# Patient Record
Sex: Female | Born: 1975 | Race: White | Hispanic: No | Smoking: Current some day smoker
Health system: Southern US, Community
[De-identification: ages and names within clinical notes are randomized; demographics above are authoritative.]

## PROBLEM LIST (undated history)

## (undated) DIAGNOSIS — G4730 Sleep apnea, unspecified: Secondary | ICD-10-CM

## (undated) DIAGNOSIS — E039 Hypothyroidism, unspecified: Secondary | ICD-10-CM

## (undated) DIAGNOSIS — I10 Essential (primary) hypertension: Secondary | ICD-10-CM

## (undated) DIAGNOSIS — Z9889 Other specified postprocedural states: Secondary | ICD-10-CM

## (undated) DIAGNOSIS — R112 Nausea with vomiting, unspecified: Secondary | ICD-10-CM

## (undated) DIAGNOSIS — E785 Hyperlipidemia, unspecified: Secondary | ICD-10-CM

## (undated) HISTORY — DX: Hyperlipidemia, unspecified: E78.5

## (undated) HISTORY — PX: KNEE SURGERY: SHX244

## (undated) HISTORY — PX: TUBAL LIGATION: SHX77

## (undated) HISTORY — PX: ABDOMINAL HYSTERECTOMY: SHX81

## (undated) HISTORY — PX: APPENDECTOMY: SHX54

## (undated) HISTORY — DX: Sleep apnea, unspecified: G47.30

## (undated) HISTORY — PX: TONSILLECTOMY: SUR1361

## (undated) HISTORY — DX: Essential (primary) hypertension: I10

## (undated) HISTORY — PX: DG GALL BLADDER: HXRAD326

## (undated) HISTORY — PX: CHOLECYSTECTOMY: SHX55

---

## 2004-03-17 ENCOUNTER — Emergency Department: Payer: Self-pay | Admitting: Emergency Medicine

## 2011-05-15 ENCOUNTER — Ambulatory Visit: Payer: Self-pay | Admitting: Obstetrics & Gynecology

## 2011-05-15 LAB — POTASSIUM: Potassium: 3.3 mmol/L — ABNORMAL LOW (ref 3.5–5.1)

## 2011-05-15 LAB — CBC
HCT: 44.1 % (ref 35.0–47.0)
MCHC: 32.6 g/dL (ref 32.0–36.0)
MCV: 89 fL (ref 80–100)
Platelet: 293 10*3/uL (ref 150–440)
RBC: 4.93 10*6/uL (ref 3.80–5.20)
WBC: 9.8 10*3/uL (ref 3.6–11.0)

## 2011-05-22 ENCOUNTER — Ambulatory Visit: Payer: Self-pay | Admitting: Obstetrics & Gynecology

## 2011-05-22 LAB — PREGNANCY, URINE: Pregnancy Test, Urine: NEGATIVE m[IU]/mL

## 2011-05-23 LAB — HEMOGLOBIN: HGB: 12.3 g/dL (ref 12.0–16.0)

## 2012-01-16 HISTORY — PX: SUPRACERVICAL ABDOMINAL HYSTERECTOMY: SHX5393

## 2013-03-18 ENCOUNTER — Ambulatory Visit: Payer: Self-pay | Admitting: Family Medicine

## 2014-02-18 ENCOUNTER — Ambulatory Visit: Payer: Self-pay | Admitting: Unknown Physician Specialty

## 2014-04-30 ENCOUNTER — Other Ambulatory Visit: Payer: Self-pay | Admitting: Unknown Physician Specialty

## 2014-04-30 DIAGNOSIS — D440 Neoplasm of uncertain behavior of thyroid gland: Secondary | ICD-10-CM

## 2014-05-09 NOTE — Op Note (Signed)
PATIENT NAME:  Ashley Clarke, TORTORA MR#:  [id number] DATE OF BIRTH:  1975-12-19  DATE OF PROCEDURE:  05/22/2011  PREOPERATIVE DIAGNOSES:  1. Menorrhagia. 2. Pelvic pain.   POSTOPERATIVE DIAGNOSES: 1. Menorrhagia. 2. Pelvic pain. 3. Adhesions.   PROCEDURES:  1. Laparoscopic supracervical hysterectomy.  2. Lysis of adhesions.  3. Cystoscopy.   SURGEON: Glean Salen, MD   ASSISTANT: Donzetta Matters, MD   ANESTHESIA: General.   ESTIMATED BLOOD LOSS: 150 mL.   COMPLICATIONS: None.   FINDINGS: There was a cystic left ovary. There were adhesions of the omentum to the anterior abdominal wall, adhesions to the adnexa bilaterally to the pelvic sidewall walls and posterior cul-de-sac, and adhesions of the lower uterine segment up to the anterior abdominal wall near the bladder. Cystoscopy reveals bilateral spill of blue dye through each ureteral orifice and no bladder injury   SPECIMEN: Uterus without cervix.   DISPOSITION: To recovery room in stable condition.   TECHNIQUE: The patient is prepped and draped in the usual sterile fashion after adequate anesthesia is obtained in the dorsal lithotomy position. A Foley catheter is inserted. A sponge stick is placed per vagina for manipulation purposes. Attention is then turned to the abdomen where a Veress needle is inserted through a 5 mm infraumbilical incision after Marcaine is used to anesthetize the skin in an area of a prior scar. Veress needle placement is confirmed using the hanging drop technique and the abdomen is then insufflated with CO2 gas. A 5 mm trocar is then inserted under direct visualization with the laparoscope with no injuries or bleeding noted. The patient is placed in Trendelenburg positioning. Adhesions are visualized. There are no adhesions in the area of trocar placement. An 11 mm trocar is placed in the right lower quadrant lateral to the inferior epigastric blood vessels with no injuries or bleeding noted. The camera is  placed in this port to visualize the umbilical port with no perforation or injuries noted. Adhesions that are in view of the pelvis are carefully dissected using the 5 mm Harmonic scalpel. Adhesions are well away from any bowel wall or near any structures such as ureter. A 5 mm trocar is placed in the left lower quadrant lateral to the inferior epigastric blood vessels with no injuries or bleeding noted. The patient is placed in full Trendelenburg positioning to adequately perform the surgery.   The uterus is grasped with a tenaculum. The uteroovarian blood vessels and ligaments are carefully coagulated and cut using the Harmonic scalpel along with bipolar cautery device as needed. The ovary is dissected away from the uterus and the main blood supply to the ovaries are preserved. The dissection is carried down alongside the uterus to the level of the uterine arteries which are then carefully coagulated and cut. Adhesions are dissected off the lower uterine segment to preserve bladder. Uterus is then amputated using the 5 mm Harmonic scalpel and the endocervical canal is cauterized.   A morcellator device is placed where the right trocar was located and the uterus is removed by morcellation process without complication. The pelvic cavity is then irrigated with aspiration of all fluid. Hemostasis is assured using electrocautery as well as Arista to help improve hemostasis. Interceed is placed over the cervical stump.   Cystoscopy is performed with saline distention of the bladder and a 30 degree cystoscope is inserted. Indigo carmine has been injected IV and during observation during cystoscopy blue dye is seen to extrude from each ureteral orifice. The cystoscope is  removed and Foley catheter is reinserted.   Laparoscopy is re-visualized with no bleeding noted. The right lower quadrant rectus fascia is closed with a fascial closure device using 0 Vicryl suture. Gas is then expelled and trocars are removed and  the patient is leveled. Additional suture is placed in the right lower quadrant incision to obtain hemostasis and then all skin incisions are closed with Dermabond and covered with sterile bandages. Sponge stick is removed and Foley catheter is left in place. The patient goes to the recovery room in stable condition. All sponge, instrument, and needle counts are correct.   ____________________________ R. Barnett Applebaum, MD rph:drc D: 05/22/2011 09:19:12 ET T: 05/22/2011 11:29:05 ET JOB#: [id number]  cc: Glean Salen, MD, <Dictator> Gae Dry MD ELECTRONICALLY SIGNED 05/22/2011 23:27

## 2014-05-10 LAB — HM PAP SMEAR

## 2014-05-11 LAB — BASIC METABOLIC PANEL: Glucose: 124 mg/dL

## 2014-05-11 LAB — LIPID PANEL
HDL: 35 mg/dL (ref 35–70)
LDL Cholesterol: 117 mg/dL

## 2014-05-11 LAB — TSH: TSH: 7.67 u[IU]/mL — AB (ref <=5.90)

## 2014-05-11 LAB — HEMOGLOBIN A1C: HEMOGLOBIN A1C: 5.3 % (ref 4.0–6.0)

## 2014-07-07 LAB — TSH: TSH: 4.18

## 2014-08-19 ENCOUNTER — Ambulatory Visit
Admission: RE | Admit: 2014-08-19 | Discharge: 2014-08-19 | Disposition: A | Discharge status: Home / Self Care | Payer: Managed Care, Other (non HMO) | Source: Ambulatory Visit | Attending: Unknown Physician Specialty | Admitting: Unknown Physician Specialty

## 2014-08-19 DIAGNOSIS — D440 Neoplasm of uncertain behavior of thyroid gland: Secondary | ICD-10-CM

## 2014-08-19 DIAGNOSIS — E042 Nontoxic multinodular goiter: Secondary | ICD-10-CM | POA: Insufficient documentation

## 2014-08-19 DIAGNOSIS — Z09 Encounter for follow-up examination after completed treatment for conditions other than malignant neoplasm: Secondary | ICD-10-CM | POA: Diagnosis present

## 2014-08-20 ENCOUNTER — Ambulatory Visit (INDEPENDENT_AMBULATORY_CARE_PROVIDER_SITE_OTHER): Payer: Commercial Indemnity | Admitting: Family Medicine

## 2014-08-20 ENCOUNTER — Encounter: Payer: Self-pay | Admitting: Family Medicine

## 2014-08-20 VITALS — BP 124/81 | HR 86 | Temp 98.9°F | Resp 16 | Ht 68.0 in | Wt 274.4 lb

## 2014-08-20 DIAGNOSIS — J45909 Unspecified asthma, uncomplicated: Secondary | ICD-10-CM | POA: Insufficient documentation

## 2014-08-20 DIAGNOSIS — E042 Nontoxic multinodular goiter: Secondary | ICD-10-CM | POA: Insufficient documentation

## 2014-08-20 DIAGNOSIS — I10 Essential (primary) hypertension: Secondary | ICD-10-CM | POA: Diagnosis not present

## 2014-08-20 DIAGNOSIS — E039 Hypothyroidism, unspecified: Secondary | ICD-10-CM | POA: Diagnosis not present

## 2014-08-20 MED ORDER — LEVOTHYROXINE SODIUM 112 MCG PO TABS: 112.0000 ug | ORAL_TABLET | Freq: Every day | ORAL | Status: DC

## 2014-08-20 NOTE — Progress Notes (Signed)
Name: Ashley Clarke   MRN: [id number]    DOB: 02-19-1975   Date:08/20/2014       Progress Note  Subjective  Chief Complaint  Chief Complaint  Patient presents with  . Hypertension    HPI  Here for f/u of HBP.  Has multinodular goiter.  Sees Dr. Tami Ribas re thyroid.  Feeling well.  No c/o   TSH from 07/07/14-4.18. Past Medical History  Diagnosis Date  . Hypertension   . Hyperlipidemia   . Stroke     History  Substance Use Topics  . Smoking status: Never Smoker   . Smokeless tobacco: Never Used  . Alcohol Use: No     Current outpatient prescriptions:  .  albuterol (PROAIR HFA) 108 (90 BASE) MCG/ACT inhaler, Inhale into the lungs., Disp: , Rfl:  .  losartan (COZAAR) 50 MG tablet, Take by mouth., Disp: , Rfl:  .  cyclobenzaprine (FLEXERIL) 10 MG tablet, Take by mouth., Disp: , Rfl:  .  levothyroxine (SYNTHROID, LEVOTHROID) 112 MCG tablet, Take 1 tablet (112 mcg total) by mouth daily., Disp: 90 tablet, Rfl: 3  No Known Allergies  Review of Systems  Constitutional: Positive for weight loss. Negative for fever, chills and malaise/fatigue.  HENT: Negative for hearing loss.   Eyes: Negative for blurred vision and double vision.  Respiratory: Negative for cough, sputum production, shortness of breath and wheezing.   Cardiovascular: Negative for chest pain, palpitations, orthopnea and leg swelling.  Gastrointestinal: Negative for heartburn, nausea, vomiting, abdominal pain, diarrhea and blood in stool.  Genitourinary: Negative for dysuria, urgency and frequency.  Musculoskeletal: Negative for myalgias and joint pain.  Skin: Negative for rash.  Neurological: Negative for dizziness, sensory change, focal weakness, weakness and headaches.  Psychiatric/Behavioral: Negative for depression. The patient is not nervous/anxious.       Objective  Filed Vitals:   08/20/14 1432  BP: 124/81  Pulse: 86  Temp: 98.9 F (37.2 C)  Resp: 16  Height: 5\' 8"  (1.727 m)  Weight: 274 lb  6.4 oz (124.467 kg)     Physical Exam  Constitutional: She is well-developed, well-nourished, and in no distress. No distress.  HENT:  Head: Normocephalic and atraumatic.  Eyes: Conjunctivae and EOM are normal. Pupils are equal, round, and reactive to light. No scleral icterus.  Neck: Normal range of motion. Neck supple. Thyroid mass (multinodulat goiter, R>L.) and thyromegaly present.  Cardiovascular: Normal rate, regular rhythm, normal heart sounds and intact distal pulses.  Exam reveals no gallop and no friction rub.   No murmur heard. Pulmonary/Chest: Effort normal and breath sounds normal. No respiratory distress. She has no wheezes. She has no rales.  Abdominal: Soft. Bowel sounds are normal. She exhibits no distension and no mass. There is no tenderness.  Musculoskeletal: She exhibits no edema.  Lymphadenopathy:    She has cervical adenopathy.  Vitals reviewed.        Assessment & Plan  Problem List Items Addressed This Visit      Endocrine   Adult hypothyroidism   Relevant Medications   levothyroxine (SYNTHROID, LEVOTHROID) 112 MCG tablet   Other Relevant Orders   TSH    Other Visit Diagnoses    Essential hypertension    -  Primary      1. Essential hypertension    2. Hypothyroidism, unspecified hypothyroidism type   - levothyroxine (SYNTHROID, LEVOTHROID) 112 MCG tablet; Take 1 tablet (112 mcg total) by mouth daily.  Dispense: 90 tablet; Refill: 3

## 2014-08-20 NOTE — Patient Instructions (Addendum)
Continue f/u with Dr. Tami Ribas.  Continue current meds except for thyroid change  Plan repeat TSH in 2 months.

## 2014-09-16 ENCOUNTER — Other Ambulatory Visit: Payer: Self-pay | Admitting: Family Medicine

## 2014-09-16 DIAGNOSIS — E039 Hypothyroidism, unspecified: Secondary | ICD-10-CM

## 2014-09-16 MED ORDER — LEVOTHYROXINE SODIUM 112 MCG PO TABS: 112.0000 ug | ORAL_TABLET | Freq: Every day | ORAL | Status: DC

## 2014-09-16 MED ORDER — LOSARTAN POTASSIUM 50 MG PO TABS: 50.0000 mg | ORAL_TABLET | Freq: Every day | ORAL | Status: DC

## 2014-10-05 ENCOUNTER — Encounter: Payer: Self-pay | Admitting: Family Medicine

## 2014-10-11 ENCOUNTER — Encounter: Payer: Self-pay | Admitting: Family Medicine

## 2014-10-11 ENCOUNTER — Ambulatory Visit (INDEPENDENT_AMBULATORY_CARE_PROVIDER_SITE_OTHER): Payer: Commercial Indemnity | Admitting: Family Medicine

## 2014-10-11 VITALS — BP 137/83 | HR 76 | Temp 98.4°F | Resp 16 | Ht 68.0 in | Wt 278.0 lb

## 2014-10-11 DIAGNOSIS — E669 Obesity, unspecified: Secondary | ICD-10-CM | POA: Diagnosis not present

## 2014-10-11 DIAGNOSIS — F17200 Nicotine dependence, unspecified, uncomplicated: Secondary | ICD-10-CM

## 2014-10-11 DIAGNOSIS — Z720 Tobacco use: Secondary | ICD-10-CM

## 2014-10-11 NOTE — Patient Instructions (Signed)
Referral made to Maury at [facility] for [place] weight loss and phone # given for Smoking Cessation info from [facility]. [phone]

## 2014-10-11 NOTE — Progress Notes (Signed)
Name: Ashley Clarke   MRN: [id number]    DOB: [birth year]   Date:10/11/2014       Progress Note  Subjective  Chief Complaint  Chief Complaint  Patient presents with  . Annual Exam    insurance paper work    HPI Patient here for paperwork asking for exemption from [employer]) requirements re: weight loss and smoking cessation.  She is still smoking.  Reports 1/2 ppd.  Weight has actually gone up 4# since last seem 4-6 weeks ago.  No problem-specific assessment & plan notes found for this encounter.   Past Medical History  Diagnosis Date  . Hypertension   . Hyperlipidemia   . Stroke     Social History  Substance Use Topics  . Smoking status: Current Every Day Smoker -- 1.00 packs/day    Types: Cigarettes  . Smokeless tobacco: Never Used  . Alcohol Use: No     Current outpatient prescriptions:  .  albuterol (PROAIR HFA) 108 (90 BASE) MCG/ACT inhaler, Inhale into the lungs., Disp: , Rfl:  .  cyclobenzaprine (FLEXERIL) 10 MG tablet, Take by mouth., Disp: , Rfl:  .  fluticasone (FLONASE) 50 MCG/ACT nasal spray, , Disp: , Rfl:  .  levothyroxine (SYNTHROID, LEVOTHROID) 112 MCG tablet, Take 1 tablet (112 mcg total) by mouth daily., Disp: 90 tablet, Rfl: 3 .  losartan (COZAAR) 50 MG tablet, Take 1 tablet (50 mg total) by mouth daily., Disp: 90 tablet, Rfl: 3  No Known Allergies  Review of Systems  Constitutional: Negative for fever, chills, weight loss and malaise/fatigue.  HENT: Negative for hearing loss.   Eyes: Negative for blurred vision and double vision.  Respiratory: Negative for cough, sputum production, shortness of breath and wheezing.   Cardiovascular: Negative for chest pain, palpitations, orthopnea and leg swelling.  Gastrointestinal: Positive for heartburn (occ.). Negative for nausea, vomiting, abdominal pain, diarrhea and blood in stool.  Genitourinary: Negative for dysuria, urgency and frequency.  Musculoskeletal: Negative for myalgias.   Neurological: Negative for dizziness, tremors, sensory change, focal weakness, seizures, weakness and headaches.      Objective  Filed Vitals:   10/11/14 1318  BP: 137/83  Pulse: 76  Temp: 98.4 F (36.9 C)  TempSrc: Oral  Resp: 16  Height: 5\' 8"  (1.727 m)  Weight: 278 lb (126.1 kg)     Physical Exam  Constitutional: She is well-developed, well-nourished, and in no distress. No distress.  HENT:  Head: Normocephalic and atraumatic.  Neck: Normal range of motion. Neck supple. Carotid bruit is not present. No thyromegaly present.  Cardiovascular: Normal rate, regular rhythm, normal heart sounds and intact distal pulses.  Exam reveals no gallop and no friction rub.   No murmur heard. Pulmonary/Chest: Effort normal and breath sounds normal. No respiratory distress. She has no wheezes. She has no rales.  Abdominal: Soft. Bowel sounds are normal. She exhibits no distension, no abdominal bruit and no mass. There is no tenderness.  obese  Musculoskeletal: She exhibits no edema.  Lymphadenopathy:    She has no cervical adenopathy.  Vitals reviewed.     No results found for this or any previous visit (from the past 2160 hour(s)).   Assessment & Plan  1. Obesity  - Amb ref to Medical Nutrition Therapy-MNT  2. Smoker  Smoking Cessation # for [place]- [phone]

## 2014-12-28 ENCOUNTER — Encounter: Payer: Commercial Indemnity | Admitting: Family Medicine

## 2014-12-28 NOTE — Progress Notes (Signed)
This encounter was created in error - please disregard.

## 2014-12-28 NOTE — Addendum Note (Signed)
Addended by: Larene Beach on: 12/28/2014 02:00 PM   Modules accepted: Miquel Dunn

## 2014-12-28 NOTE — Addendum Note (Signed)
Addended by: Larene Beach on: 12/28/2014 02:03 PM   Modules accepted: Level of Service, SmartSet

## 2015-08-01 ENCOUNTER — Ambulatory Visit (INDEPENDENT_AMBULATORY_CARE_PROVIDER_SITE_OTHER): Payer: Managed Care, Other (non HMO) | Admitting: Family Medicine

## 2015-08-01 ENCOUNTER — Encounter: Payer: Self-pay | Admitting: Family Medicine

## 2015-08-01 VITALS — BP 160/100 | HR 60 | Temp 98.2°F | Resp 16 | Ht 68.0 in | Wt 271.0 lb

## 2015-08-01 DIAGNOSIS — K219 Gastro-esophageal reflux disease without esophagitis: Secondary | ICD-10-CM | POA: Diagnosis not present

## 2015-08-01 DIAGNOSIS — E034 Atrophy of thyroid (acquired): Secondary | ICD-10-CM | POA: Diagnosis not present

## 2015-08-01 DIAGNOSIS — I10 Essential (primary) hypertension: Secondary | ICD-10-CM | POA: Diagnosis not present

## 2015-08-01 DIAGNOSIS — E038 Other specified hypothyroidism: Secondary | ICD-10-CM | POA: Diagnosis not present

## 2015-08-01 DIAGNOSIS — E669 Obesity, unspecified: Secondary | ICD-10-CM

## 2015-08-01 DIAGNOSIS — Z0000 Encounter for general adult medical examination without abnormal findings: Secondary | ICD-10-CM | POA: Diagnosis not present

## 2015-08-01 MED ORDER — OMEPRAZOLE 40 MG PO CPDR: 40.0000 mg | DELAYED_RELEASE_CAPSULE | Freq: Every day | ORAL | Status: DC

## 2015-08-01 MED ORDER — LOSARTAN POTASSIUM 100 MG PO TABS: 50.0000 mg | ORAL_TABLET | Freq: Every day | ORAL | Status: DC

## 2015-08-01 MED ORDER — LOSARTAN POTASSIUM 100 MG PO TABS: 100.0000 mg | ORAL_TABLET | Freq: Every day | ORAL | Status: DC

## 2015-08-01 NOTE — Progress Notes (Signed)
Name: Ashley Clarke   MRN: [id number]    DOB: 07-30-1975   Date:08/01/2015       Progress Note  Subjective  Chief Complaint  Chief Complaint  Patient presents with  . Annual Exam    HPI Here for annual female exam.  She has had a hysterectomy 2014.  Ovaries remain.  ? Supracervical hysterectomy.   She has mild asthma.  Rare use of Albuterol. She takes Levothyroxine and Losartan.  Some BPs at home run >150 sys.  No problem-specific assessment & plan notes found for this encounter.   Past Medical History  Diagnosis Date  . Hypertension   . Hyperlipidemia   . Stroke [facility])     Past Surgical History  Procedure Laterality Date  . Dg gall bladder    . Appendectomy    . Tonsillectomy    . Knee surgery    . Abdominal hysterectomy      Family History  Problem Relation Age of Onset  . Stroke Paternal Aunt   . Stroke Maternal Grandmother     Social History   Social History  . Marital Status: Divorced    Spouse Name: N/A  . Number of Children: N/A  . Years of Education: N/A   Occupational History  . Not on file.   Social History Main Topics  . Smoking status: Current Every Day Smoker -- 1.00 packs/day    Types: Cigarettes  . Smokeless tobacco: Never Used  . Alcohol Use: No  . Drug Use: No  . Sexual Activity: Not on file   Other Topics Concern  . Not on file   Social History Narrative     Current outpatient prescriptions:  .  albuterol (PROAIR HFA) 108 (90 BASE) MCG/ACT inhaler, Inhale 2 puffs into the lungs every 6 (six) hours as needed. , Disp: , Rfl:  .  fluticasone (FLONASE) 50 MCG/ACT nasal spray, Place 2 sprays into both nostrils as needed. , Disp: , Rfl:  .  levothyroxine (SYNTHROID, LEVOTHROID) 112 MCG tablet, Take 1 tablet (112 mcg total) by mouth daily., Disp: 90 tablet, Rfl: 3 .  losartan (COZAAR) 100 MG tablet, Take 1 tablet (100 mg total) by mouth daily., Disp: 90 tablet, Rfl: 3 .  etodolac (LODINE) 500 MG tablet, Take 500 mg by mouth 2 (two)  times daily., Disp: , Rfl:  .  omeprazole (PRILOSEC) 40 MG capsule, Take 1 capsule (40 mg total) by mouth daily., Disp: 30 capsule, Rfl: 3  Not on File   Review of Systems  Constitutional: Negative for fever, chills, weight loss and malaise/fatigue.  HENT: Negative for hearing loss.   Eyes: Negative for blurred vision and double vision.  Respiratory: Negative for cough, shortness of breath and wheezing.   Cardiovascular: Negative for chest pain, palpitations and leg swelling.  Gastrointestinal: Positive for heartburn (daily). Negative for abdominal pain and blood in stool.  Genitourinary: Negative for dysuria, urgency and frequency.  Musculoskeletal: Negative for myalgias and joint pain.  Skin: Negative for rash.  Neurological: Negative for dizziness, tremors, weakness and headaches.  Psychiatric/Behavioral: Negative for depression and substance abuse.      Objective  Filed Vitals:   08/01/15 1412 08/01/15 1456  BP: 149/93 160/100  Pulse: 60   Temp: 98.2 F (36.8 C)   TempSrc: Oral   Resp: 16   Height: 5\' 8"  (1.727 m)   Weight: 271 lb (122.925 kg)     Physical Exam  Constitutional: She is oriented to person, place, and time. No distress.  HENT:  Head: Normocephalic and atraumatic.  Right Ear: External ear normal.  Left Ear: External ear normal.  Nose: Nose normal.  Mouth/Throat: Oropharynx is clear and moist.  Eyes: Conjunctivae and EOM are normal. Pupils are equal, round, and reactive to light. No scleral icterus.  Fundoscopic exam:      The right eye shows no arteriolar narrowing, no AV nicking, no exudate, no hemorrhage and no papilledema.       The left eye shows no arteriolar narrowing, no AV nicking, no exudate, no hemorrhage and no papilledema.  Neck: Normal range of motion. Neck supple. Carotid bruit is not present. No thyromegaly present.  Cardiovascular: Normal rate, regular rhythm and normal heart sounds.  Exam reveals no gallop and no friction rub.   No  murmur heard. Pulmonary/Chest: Effort normal and breath sounds normal. No respiratory distress. She has no wheezes. She has no rales. Right breast exhibits no inverted nipple, no mass, no nipple discharge, no skin change and no tenderness. Left breast exhibits no inverted nipple, no mass, no nipple discharge, no skin change and no tenderness. Breasts are symmetrical.  Abdominal: Soft. Bowel sounds are normal. She exhibits no distension and no mass. There is no tenderness.  Genitourinary: Vagina normal, cervix normal, right adnexa normal and left adnexa normal.  Uterus absent  Musculoskeletal: Normal range of motion. She exhibits no edema.  Lymphadenopathy:    She has no cervical adenopathy.  Neurological: She is alert and oriented to person, place, and time.  Vitals reviewed.      No results found for this or any previous visit (from the past 2160 hour(s)).   Assessment & Plan  Problem List Items Addressed This Visit      Cardiovascular and Mediastinum   Essential (primary) hypertension   Relevant Medications   losartan (COZAAR) 100 MG tablet   Other Relevant Orders   Comprehensive Metabolic Panel (CMET)   Lipid Profile     Digestive   GERD (gastroesophageal reflux disease)   Relevant Medications   omeprazole (PRILOSEC) 40 MG capsule     Endocrine   Adult hypothyroidism   Relevant Orders   TSH     Other   Adiposity   Relevant Orders   CBC with Differential   Health maintenance examination - Primary      Meds ordered this encounter  Medications  . etodolac (LODINE) 500 MG tablet    Sig: Take 500 mg by mouth 2 (two) times daily.  Marland Kitchen omeprazole (PRILOSEC) 40 MG capsule    Sig: Take 1 capsule (40 mg total) by mouth daily.    Dispense:  30 capsule    Refill:  3  . DISCONTD: losartan (COZAAR) 100 MG tablet    Sig: Take 0.5 tablets (50 mg total) by mouth daily.    Dispense:  90 tablet    Refill:  3  . losartan (COZAAR) 100 MG tablet    Sig: Take 1 tablet (100 mg  total) by mouth daily.    Dispense:  90 tablet    Refill:  3   1. Health maintenance examination   2. Essential (primary) hypertension  - Comprehensive Metabolic Panel (CMET) - Lipid Profile - losartan (COZAAR) 100 MG tablet; Take 1 tablet (100 mg total) by mouth daily.  Dispense: 90 tablet; Refill: 3 (increased from 50 mg/d). 3. 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 Adiposity  - CBC with Differential  5. Gastroesophageal reflux disease without esophagitis  - omeprazole (  PRILOSEC) 40 MG capsule; Take 1 capsule (40 mg total) by mouth daily.  Dispense: 30 capsule; Refill: 3

## 2015-08-03 ENCOUNTER — Other Ambulatory Visit: Payer: Managed Care, Other (non HMO)

## 2015-08-03 LAB — CBC WITH DIFFERENTIAL/PLATELET
BASOS PCT: 0 %
Basophils Absolute: 0 cells/uL (ref 0–200)
EOS ABS: 0 {cells}/uL — AB (ref 15–500)
Eosinophils Relative: 0 %
HEMATOCRIT: 45.9 % — AB (ref 35.0–45.0)
HEMOGLOBIN: 15.4 g/dL (ref 11.7–15.5)
LYMPHS ABS: 2640 {cells}/uL (ref 850–3900)
Lymphocytes Relative: 20 %
MCH: 30 pg (ref 27.0–33.0)
MCHC: 33.6 g/dL (ref 32.0–36.0)
MCV: 89.3 fL (ref 80.0–100.0)
MONO ABS: 528 {cells}/uL (ref 200–950)
MPV: 10.6 fL (ref 7.5–12.5)
Monocytes Relative: 4 %
Neutro Abs: 10032 cells/uL — ABNORMAL HIGH (ref 1500–7800)
Neutrophils Relative %: 76 %
Platelets: 284 10*3/uL (ref 140–400)
RBC: 5.14 MIL/uL — AB (ref 3.80–5.10)
RDW: 13.6 % (ref 11.0–15.0)
WBC: 13.2 10*3/uL — AB (ref 3.8–10.8)

## 2015-08-03 LAB — COMPREHENSIVE METABOLIC PANEL
ALK PHOS: 59 U/L (ref 33–115)
ALT: 18 U/L (ref 6–29)
AST: 12 U/L (ref 10–30)
Albumin: 4.3 g/dL (ref 3.6–5.1)
BILIRUBIN TOTAL: 1.1 mg/dL (ref 0.2–1.2)
BUN: 8 mg/dL (ref 7–25)
CALCIUM: 9.1 mg/dL (ref 8.6–10.2)
CO2: 19 mmol/L — ABNORMAL LOW (ref 20–31)
Chloride: 106 mmol/L (ref 98–110)
Creat: 0.71 mg/dL (ref 0.50–1.10)
GLUCOSE: 96 mg/dL (ref 65–99)
POTASSIUM: 4 mmol/L (ref 3.5–5.3)
Sodium: 140 mmol/L (ref 135–146)
TOTAL PROTEIN: 7.5 g/dL (ref 6.1–8.1)

## 2015-08-03 LAB — LIPID PANEL
CHOLESTEROL: 200 mg/dL (ref 125–200)
HDL: 44 mg/dL — AB (ref >=46)
LDL Cholesterol: 140 mg/dL — ABNORMAL HIGH (ref <130)
Total CHOL/HDL Ratio: 4.5 Ratio (ref <=5.0)
Triglycerides: 82 mg/dL (ref <150)
VLDL: 16 mg/dL (ref <30)

## 2015-08-03 LAB — TSH: TSH: 1.93 mIU/L

## 2015-09-01 ENCOUNTER — Other Ambulatory Visit: Payer: Self-pay | Admitting: Family Medicine

## 2015-09-01 DIAGNOSIS — E039 Hypothyroidism, unspecified: Secondary | ICD-10-CM

## 2015-09-19 ENCOUNTER — Other Ambulatory Visit: Payer: Self-pay | Admitting: Family Medicine

## 2015-09-19 DIAGNOSIS — K219 Gastro-esophageal reflux disease without esophagitis: Secondary | ICD-10-CM

## 2015-10-13 ENCOUNTER — Ambulatory Visit (INDEPENDENT_AMBULATORY_CARE_PROVIDER_SITE_OTHER): Payer: Managed Care, Other (non HMO) | Admitting: Family Medicine

## 2015-10-13 ENCOUNTER — Encounter: Payer: Self-pay | Admitting: Family Medicine

## 2015-10-13 ENCOUNTER — Ambulatory Visit: Payer: Managed Care, Other (non HMO) | Admitting: Family Medicine

## 2015-10-13 VITALS — BP 150/90 | HR 98 | Temp 98.2°F | Resp 16 | Ht 68.0 in | Wt 276.0 lb

## 2015-10-13 DIAGNOSIS — E669 Obesity, unspecified: Secondary | ICD-10-CM | POA: Diagnosis not present

## 2015-10-13 DIAGNOSIS — Z23 Encounter for immunization: Secondary | ICD-10-CM

## 2015-10-13 DIAGNOSIS — K219 Gastro-esophageal reflux disease without esophagitis: Secondary | ICD-10-CM

## 2015-10-13 DIAGNOSIS — E038 Other specified hypothyroidism: Secondary | ICD-10-CM

## 2015-10-13 DIAGNOSIS — E034 Atrophy of thyroid (acquired): Secondary | ICD-10-CM

## 2015-10-13 DIAGNOSIS — D72829 Elevated white blood cell count, unspecified: Secondary | ICD-10-CM

## 2015-10-13 DIAGNOSIS — I10 Essential (primary) hypertension: Secondary | ICD-10-CM

## 2015-10-13 LAB — CBC WITH DIFFERENTIAL/PLATELET
BASOS ABS: 0 {cells}/uL (ref 0–200)
Basophils Relative: 0 %
EOS ABS: 282 {cells}/uL (ref 15–500)
Eosinophils Relative: 3 %
HCT: 43.9 % (ref 35.0–45.0)
HEMOGLOBIN: 14.5 g/dL (ref 11.7–15.5)
Lymphocytes Relative: 29 %
Lymphs Abs: 2726 cells/uL (ref 850–3900)
MCH: 29.9 pg (ref 27.0–33.0)
MCHC: 33 g/dL (ref 32.0–36.0)
MCV: 90.5 fL (ref 80.0–100.0)
MONOS PCT: 6 %
MPV: 10.1 fL (ref 7.5–12.5)
Monocytes Absolute: 564 cells/uL (ref 200–950)
NEUTROS ABS: 5828 {cells}/uL (ref 1500–7800)
NEUTROS PCT: 62 %
PLATELETS: 261 10*3/uL (ref 140–400)
RBC: 4.85 MIL/uL (ref 3.80–5.10)
RDW: 13.1 % (ref 11.0–15.0)
WBC: 9.4 10*3/uL (ref 3.8–10.8)

## 2015-10-13 MED ORDER — HYDROCHLOROTHIAZIDE 25 MG PO TABS: 25.0000 mg | ORAL_TABLET | Freq: Every day | ORAL | 6 refills | Status: DC

## 2015-10-13 NOTE — Progress Notes (Signed)
Name: Ashley Clarke   MRN: [id number]    DOB: 1975-06-17   Date:10/13/2015       Progress Note  Subjective  Chief Complaint  Chief Complaint  Patient presents with  . Hypertension  . Labs Only    HPI Here for f/u of HBP.   She is not losing weight.  She is still smoking 1/4 ppd.  No problem-specific Assessment & Plan notes found for this encounter.   Past Medical History:  Diagnosis Date  . Hyperlipidemia   . Hypertension   . Stroke [facility])     Past Surgical History:  Procedure Laterality Date  . ABDOMINAL HYSTERECTOMY    . APPENDECTOMY    . DG GALL BLADDER    . KNEE SURGERY    . TONSILLECTOMY      Family History  Problem Relation Age of Onset  . Stroke Paternal Aunt   . Stroke Maternal Grandmother     Social History   Social History  . Marital status: Divorced    Spouse name: N/A  . Number of children: N/A  . Years of education: N/A   Occupational History  . Not on file.   Social History Main Topics  . Smoking status: Current Every Day Smoker    Packs/day: 1.00    Types: Cigarettes  . Smokeless tobacco: Never Used  . Alcohol use No  . Drug use: No  . Sexual activity: Not on file   Other Topics Concern  . Not on file   Social History Narrative  . No narrative on file     Current Outpatient Prescriptions:  .  albuterol (PROAIR HFA) 108 (90 BASE) MCG/ACT inhaler, Inhale 2 puffs into the lungs every 6 (six) hours as needed. , Disp: , Rfl:  .  etodolac (LODINE) 500 MG tablet, Take 500 mg by mouth 2 (two) times daily., Disp: , Rfl:  .  fluticasone (FLONASE) 50 MCG/ACT nasal spray, Place 2 sprays into both nostrils as needed. , Disp: , Rfl:  .  levothyroxine (SYNTHROID, LEVOTHROID) 112 MCG tablet, Take 1 tablet by mouth  daily, Disp: 90 tablet, Rfl: 3 .  omeprazole (PRILOSEC) 40 MG capsule, Take 1 capsule by mouth  daily, Disp: 90 capsule, Rfl: 3 .  hydrochlorothiazide (HYDRODIURIL) 25 MG tablet, Take 1 tablet (25 mg total) by mouth daily., Disp:  30 tablet, Rfl: 6 .  losartan (COZAAR) 100 MG tablet, Take 100 mg by mouth daily., Disp: , Rfl:   Not on File   Review of Systems  Constitutional: Negative for chills, fever, malaise/fatigue and weight loss.  HENT: Negative for hearing loss.   Eyes: Negative for blurred vision and double vision.  Respiratory: Negative for cough, shortness of breath and wheezing.   Cardiovascular: Negative for chest pain, palpitations and leg swelling.  Gastrointestinal: Negative for abdominal pain, blood in stool and heartburn.  Genitourinary: Negative for dysuria, frequency and urgency.  Musculoskeletal: Negative for joint pain and myalgias.  Skin: Negative for rash.  Neurological: Negative for dizziness, tremors, weakness and headaches.      Objective  Vitals:   10/13/15 1046 10/13/15 1110  BP: (!) 156/94 (!) 150/90  Pulse: 98   Resp: 16   Temp: 98.2 F (36.8 C)   TempSrc: Oral   Weight: 276 lb (125.2 kg)   Height: 5' 8" (1.727 m)     Physical Exam  Constitutional: She is oriented to person, place, and time and well-developed, well-nourished, and in no distress. No distress.  HENT:  Head: Normocephalic and atraumatic.  Eyes: Conjunctivae and EOM are normal. Pupils are equal, round, and reactive to light. No scleral icterus.  Neck: Normal range of motion. Neck supple. Carotid bruit is not present. No thyromegaly present.  Cardiovascular: Normal rate, regular rhythm and normal heart sounds.  Exam reveals no gallop and no friction rub.   No murmur heard. Pulmonary/Chest: Effort normal and breath sounds normal. No respiratory distress. She has no wheezes. She has no rales.  Abdominal: Soft. Bowel sounds are normal. She exhibits no distension and no mass. There is no tenderness.  Musculoskeletal: She exhibits no edema.  Lymphadenopathy:    She has no cervical adenopathy.  Neurological: She is alert and oriented to person, place, and time.  Vitals reviewed.      Recent Results (from  the past 2160 hour(s))  Comprehensive Metabolic Panel (CMET)     Status: Abnormal   Collection Time: 08/01/15  8:45 AM  Result Value Ref Range   Sodium 140 135 - 146 mmol/L   Potassium 4.0 3.5 - 5.3 mmol/L   Chloride 106 98 - 110 mmol/L   CO2 19 (L) 20 - 31 mmol/L   Glucose, Bld 96 65 - 99 mg/dL   BUN 8 7 - 25 mg/dL   Creat 0.71 0.50 - 1.10 mg/dL   Total Bilirubin 1.1 0.2 - 1.2 mg/dL   Alkaline Phosphatase 59 33 - 115 U/L   AST 12 10 - 30 U/L   ALT 18 6 - 29 U/L   Total Protein 7.5 6.1 - 8.1 g/dL   Albumin 4.3 3.6 - 5.1 g/dL   Calcium 9.1 8.6 - 10.2 mg/dL  Lipid Profile     Status: Abnormal   Collection Time: 08/01/15  8:45 AM  Result Value Ref Range   Cholesterol 200 125 - 200 mg/dL   Triglycerides 82 <150 mg/dL   HDL 44 (L) >=46 mg/dL   Total CHOL/HDL Ratio 4.5 <=5.0 Ratio   VLDL 16 <30 mg/dL   LDL Cholesterol 140 (H) <130 mg/dL    Comment:   Total Cholesterol/HDL Ratio:CHD Risk                        Coronary Heart Disease Risk Table                                        Men       Women          1/2 Average Risk              3.4        3.3              Average Risk              5.0        4.4           2X Average Risk              9.6        7.1           3X Average Risk             23.4       11.0 Use the calculated Patient Ratio above and the CHD Risk table  to determine the patient's CHD Risk.   CBC with Differential     Status:  Abnormal   Collection Time: 08/01/15  8:45 AM  Result Value Ref Range   WBC 13.2 (H) 3.8 - 10.8 K/uL   RBC 5.14 (H) 3.80 - 5.10 MIL/uL   Hemoglobin 15.4 11.7 - 15.5 g/dL   HCT 45.9 (H) 35.0 - 45.0 %   MCV 89.3 80.0 - 100.0 fL   MCH 30.0 27.0 - 33.0 pg   MCHC 33.6 32.0 - 36.0 g/dL   RDW 13.6 11.0 - 15.0 %   Platelets 284 140 - 400 K/uL   MPV 10.6 7.5 - 12.5 fL   Neutro Abs 10,032 (H) 1,500 - 7,800 cells/uL   Lymphs Abs 2,640 850 - 3,900 cells/uL   Monocytes Absolute 528 200 - 950 cells/uL   Eosinophils Absolute 0 (L) 15 - 500  cells/uL   Basophils Absolute 0 0 - 200 cells/uL   Neutrophils Relative % 76 %   Lymphocytes Relative 20 %   Monocytes Relative 4 %   Eosinophils Relative 0 %   Basophils Relative 0 %   Smear Review Criteria for review not met     Comment: ** Please note change in unit of measure and reference range(s). **  TSH     Status: None   Collection Time: 08/01/15  8:45 AM  Result Value Ref Range   TSH 1.93 mIU/L    Comment:   Reference Range   > or = 20 Years  0.40-4.50   Pregnancy Range First trimester  0.26-2.66 Second trimester 0.55-2.73 Third trimester  0.43-2.91        Assessment & Plan  Problem List Items Addressed This Visit      Cardiovascular and Mediastinum   Essential (primary) hypertension - Primary   Relevant Medications   losartan (COZAAR) 100 MG tablet   hydrochlorothiazide (HYDRODIURIL) 25 MG tablet     Digestive   GERD (gastroesophageal reflux disease)     Endocrine   Adult hypothyroidism     Other   Adiposity    Other Visit Diagnoses    Immunization due       Relevant Orders   Flu Vaccine QUAD 36+ mos PF IM (Fluarix & Fluzone Quad PF)   Elevated white blood cell count          Meds ordered this encounter  Medications  . losartan (COZAAR) 100 MG tablet    Sig: Take 100 mg by mouth daily.  . hydrochlorothiazide (HYDRODIURIL) 25 MG tablet    Sig: Take 1 tablet (25 mg total) by mouth daily.    Dispense:  30 tablet    Refill:  6   1. Essential (primary) hypertension Cont Losartan - hydrochlorothiazide (HYDRODIURIL) 25 MG tablet; Take 1 tablet (25 mg total) by mouth daily.  Dispense: 30 tablet; Refill: 6  2. Hypothyroidism due to acquired atrophy of thyroid Cont meds  3. Adiposity   4. Gastroesophageal reflux disease without esophagitis

## 2015-11-10 ENCOUNTER — Encounter: Payer: Self-pay | Admitting: Family Medicine

## 2015-11-10 ENCOUNTER — Ambulatory Visit (INDEPENDENT_AMBULATORY_CARE_PROVIDER_SITE_OTHER): Payer: Managed Care, Other (non HMO) | Admitting: Family Medicine

## 2015-11-10 VITALS — BP 120/80 | HR 89 | Temp 97.8°F | Resp 16 | Ht 68.0 in | Wt 276.0 lb

## 2015-11-10 DIAGNOSIS — E6609 Other obesity due to excess calories: Secondary | ICD-10-CM | POA: Diagnosis not present

## 2015-11-10 DIAGNOSIS — R200 Anesthesia of skin: Secondary | ICD-10-CM | POA: Diagnosis not present

## 2015-11-10 DIAGNOSIS — E039 Hypothyroidism, unspecified: Secondary | ICD-10-CM

## 2015-11-10 DIAGNOSIS — R202 Paresthesia of skin: Secondary | ICD-10-CM

## 2015-11-10 DIAGNOSIS — I10 Essential (primary) hypertension: Secondary | ICD-10-CM

## 2015-11-10 DIAGNOSIS — IMO0001 Reserved for inherently not codable concepts without codable children: Secondary | ICD-10-CM

## 2015-11-10 DIAGNOSIS — Z6841 Body Mass Index (BMI) 40.0 and over, adult: Secondary | ICD-10-CM

## 2015-11-10 MED ORDER — ALPHA-LIPOIC ACID 600 MG PO CAPS
600.0000 mg | ORAL_CAPSULE | ORAL | 12 refills | Status: DC
Start: 2015-11-10 — End: 2018-07-10

## 2015-11-10 NOTE — Progress Notes (Signed)
Name: Ashley Clarke   MRN: [id number]    DOB: 09/15/1975   Date:11/10/2015       Progress Note  Subjective  Chief Complaint  Chief Complaint  Patient presents with  . Hypertension  . Hypothyroidism    HPI Here for f/u of HBP.  She has hypothyroidism and is on appropriate dose of thyroid med.  She is taking meds/  Has root canal scheduled for next week.  Also has myalgis parathesica of L leg with numbness and shooting pains now.  No problem-specific Assessment & Plan notes found for this encounter.   Past Medical History:  Diagnosis Date  . Hyperlipidemia   . Hypertension   . Stroke [facility])     Past Surgical History:  Procedure Laterality Date  . ABDOMINAL HYSTERECTOMY    . APPENDECTOMY    . DG GALL BLADDER    . KNEE SURGERY    . TONSILLECTOMY      Family History  Problem Relation Age of Onset  . Stroke Paternal Aunt   . Stroke Maternal Grandmother     Social History   Social History  . Marital status: Divorced    Spouse name: N/A  . Number of children: N/A  . Years of education: N/A   Occupational History  . Not on file.   Social History Main Topics  . Smoking status: Current Every Day Smoker    Packs/day: 1.00    Types: Cigarettes  . Smokeless tobacco: Never Used  . Alcohol use No  . Drug use: No  . Sexual activity: Not on file   Other Topics Concern  . Not on file   Social History Narrative  . No narrative on file     Current Outpatient Prescriptions:  .  albuterol (PROAIR HFA) 108 (90 BASE) MCG/ACT inhaler, Inhale 2 puffs into the lungs every 6 (six) hours as needed. , Disp: , Rfl:  .  amoxicillin (AMOXIL) 500 MG tablet, Take 500 mg by mouth 2 (two) times daily., Disp: , Rfl:  .  etodolac (LODINE) 500 MG tablet, Take 500 mg by mouth 2 (two) times daily., Disp: , Rfl:  .  fluticasone (FLONASE) 50 MCG/ACT nasal spray, Place 2 sprays into both nostrils as needed. , Disp: , Rfl:  .  hydrochlorothiazide (HYDRODIURIL) 25 MG tablet, Take 1  tablet (25 mg total) by mouth daily., Disp: 30 tablet, Rfl: 6 .  levothyroxine (SYNTHROID, LEVOTHROID) 112 MCG tablet, Take 1 tablet by mouth  daily, Disp: 90 tablet, Rfl: 3 .  losartan (COZAAR) 100 MG tablet, Take 100 mg by mouth daily., Disp: , Rfl:  .  omeprazole (PRILOSEC) 40 MG capsule, Take 1 capsule by mouth  daily, Disp: 90 capsule, Rfl: 3 .  Alpha-Lipoic Acid 600 MG CAPS, Take 1 capsule (600 mg total) by mouth daily after supper., Disp: 120 each, Rfl: 12  Not on File   Review of Systems  Constitutional: Negative for chills, fever, malaise/fatigue and weight loss.  HENT: Negative for hearing loss.   Eyes: Negative for blurred vision and double vision.  Respiratory: Negative for cough, shortness of breath and wheezing.   Cardiovascular: Negative for chest pain, palpitations and leg swelling.  Gastrointestinal: Negative for abdominal pain, blood in stool and heartburn.  Genitourinary: Negative for dysuria, frequency and urgency.  Musculoskeletal: Negative for joint pain and myalgias.  Skin: Negative for rash.  Neurological: Positive for tingling (L leg). Negative for dizziness, tremors, weakness and headaches.      Objective  Vitals:  11/10/15 1013 11/10/15 1102  BP: 132/79 120/80  Pulse: 89   Resp: 16   Temp: 97.8 F (36.6 C)   TempSrc: Oral   Weight: 276 lb (125.2 kg)   Height: '5\' 8"'$  (1.727 m)     Physical Exam  Constitutional: She is oriented to person, place, and time and well-developed, well-nourished, and in no distress. No distress.  HENT:  Head: Normocephalic and atraumatic.  Eyes: Conjunctivae and EOM are normal. Pupils are equal, round, and reactive to light. No scleral icterus.  Neck: Normal range of motion. Neck supple. Carotid bruit is not present. No thyromegaly present.  Cardiovascular: Normal rate, regular rhythm and normal heart sounds.  Exam reveals no gallop and no friction rub.   No murmur heard. Pulmonary/Chest: Effort normal and breath sounds  normal. No respiratory distress. She has no wheezes. She has no rales.  Abdominal: Soft. Bowel sounds are normal. She exhibits no distension and no mass. There is no tenderness.  Musculoskeletal: She exhibits no edema.  Lymphadenopathy:    She has no cervical adenopathy.  Neurological: She is alert and oriented to person, place, and time.  Oval area of parasthesia in upper ouiter L thigh  Vitals reviewed.      Recent Results (from the past 2160 hour(s))  CBC with Differential     Status: None   Collection Time: 10/13/15 11:32 AM  Result Value Ref Range   WBC 9.4 3.8 - 10.8 K/uL   RBC 4.85 3.80 - 5.10 MIL/uL   Hemoglobin 14.5 11.7 - 15.5 g/dL   HCT 43.9 35.0 - 45.0 %   MCV 90.5 80.0 - 100.0 fL   MCH 29.9 27.0 - 33.0 pg   MCHC 33.0 32.0 - 36.0 g/dL   RDW 13.1 11.0 - 15.0 %   Platelets 261 140 - 400 K/uL   MPV 10.1 7.5 - 12.5 fL   Neutro Abs 5,828 1,500 - 7,800 cells/uL   Lymphs Abs 2,726 850 - 3,900 cells/uL   Monocytes Absolute 564 200 - 950 cells/uL   Eosinophils Absolute 282 15 - 500 cells/uL   Basophils Absolute 0 0 - 200 cells/uL   Neutrophils Relative % 62 %   Lymphocytes Relative 29 %   Monocytes Relative 6 %   Eosinophils Relative 3 %   Basophils Relative 0 %   Smear Review Criteria for review not met      Assessment & Plan  Problem List Items Addressed This Visit      Cardiovascular and Mediastinum   Essential (primary) hypertension - Primary     Endocrine   Adult hypothyroidism     Other   Numbness and tingling   Relevant Medications   Alpha-Lipoic Acid 600 MG CAPS   Obesity   Relevant Orders   Amb ref to Medical Nutrition Therapy-MNT    Other Visit Diagnoses   None.     Meds ordered this encounter  Medications  . amoxicillin (AMOXIL) 500 MG tablet    Sig: Take 500 mg by mouth 2 (two) times daily.  . Alpha-Lipoic Acid 600 MG CAPS    Sig: Take 1 capsule (600 mg total) by mouth daily after supper.    Dispense:  120 each    Refill:  12    1. Numbness and tingling  - Alpha-Lipoic Acid 600 MG CAPS; Take 1 capsule (600 mg total) by mouth daily after supper.  Dispense: 120 each; Refill: 12  2. Essential (primary) hypertension Cont meds  3. Adult hypothyroidism Cont med  4. Class 3 obesity due to excess calories without serious comorbidity in adult, unspecified BMI  - Amb ref to Medical Nutrition Therapy-MNT

## 2016-01-23 ENCOUNTER — Other Ambulatory Visit: Payer: Self-pay | Admitting: Unknown Physician Specialty

## 2016-01-23 ENCOUNTER — Other Ambulatory Visit: Payer: Self-pay | Admitting: Otolaryngology

## 2016-01-23 DIAGNOSIS — E049 Nontoxic goiter, unspecified: Secondary | ICD-10-CM

## 2016-01-30 ENCOUNTER — Ambulatory Visit
Admission: RE | Admit: 2016-01-30 | Discharge: 2016-01-30 | Disposition: A | Discharge status: Home / Self Care | Payer: Managed Care, Other (non HMO) | Source: Ambulatory Visit | Attending: Unknown Physician Specialty | Admitting: Unknown Physician Specialty

## 2016-01-30 DIAGNOSIS — E042 Nontoxic multinodular goiter: Secondary | ICD-10-CM | POA: Diagnosis not present

## 2016-01-30 DIAGNOSIS — E049 Nontoxic goiter, unspecified: Secondary | ICD-10-CM

## 2016-01-30 DIAGNOSIS — E041 Nontoxic single thyroid nodule: Secondary | ICD-10-CM | POA: Diagnosis present

## 2016-02-03 ENCOUNTER — Other Ambulatory Visit: Payer: Self-pay | Admitting: Unknown Physician Specialty

## 2016-02-03 DIAGNOSIS — E041 Nontoxic single thyroid nodule: Secondary | ICD-10-CM

## 2016-02-13 ENCOUNTER — Other Ambulatory Visit: Payer: Self-pay | Admitting: Unknown Physician Specialty

## 2016-02-13 ENCOUNTER — Ambulatory Visit
Admission: RE | Admit: 2016-02-13 | Discharge: 2016-02-13 | Disposition: A | Discharge status: Home / Self Care | Payer: Managed Care, Other (non HMO) | Source: Ambulatory Visit | Attending: Unknown Physician Specialty | Admitting: Unknown Physician Specialty

## 2016-02-13 DIAGNOSIS — E042 Nontoxic multinodular goiter: Secondary | ICD-10-CM | POA: Diagnosis not present

## 2016-02-13 DIAGNOSIS — E041 Nontoxic single thyroid nodule: Secondary | ICD-10-CM

## 2016-02-13 HISTORY — DX: Hypothyroidism, unspecified: E03.9

## 2016-02-13 HISTORY — DX: Other specified postprocedural states: Z98.890

## 2016-02-13 HISTORY — DX: Nausea with vomiting, unspecified: R11.2

## 2016-02-13 NOTE — Procedures (Signed)
US thyroid biopsy on right times two  Complications:  None  Blood Loss: none  See dictation in canopy pacs

## 2016-02-14 LAB — CYTOLOGY - NON PAP

## 2016-02-16 ENCOUNTER — Other Ambulatory Visit: Payer: Self-pay | Admitting: Unknown Physician Specialty

## 2016-02-16 DIAGNOSIS — I889 Nonspecific lymphadenitis, unspecified: Secondary | ICD-10-CM

## 2016-03-13 ENCOUNTER — Other Ambulatory Visit: Payer: Self-pay | Admitting: Family Medicine

## 2016-03-13 ENCOUNTER — Ambulatory Visit (INDEPENDENT_AMBULATORY_CARE_PROVIDER_SITE_OTHER): Payer: Managed Care, Other (non HMO) | Admitting: Family Medicine

## 2016-03-13 ENCOUNTER — Encounter: Payer: Self-pay | Admitting: Family Medicine

## 2016-03-13 VITALS — BP 140/87 | HR 82 | Temp 98.4°F | Resp 16 | Ht 68.0 in | Wt 284.0 lb

## 2016-03-13 DIAGNOSIS — E042 Nontoxic multinodular goiter: Secondary | ICD-10-CM

## 2016-03-13 DIAGNOSIS — K219 Gastro-esophageal reflux disease without esophagitis: Secondary | ICD-10-CM

## 2016-03-13 DIAGNOSIS — E6609 Other obesity due to excess calories: Secondary | ICD-10-CM

## 2016-03-13 DIAGNOSIS — J4520 Mild intermittent asthma, uncomplicated: Secondary | ICD-10-CM

## 2016-03-13 DIAGNOSIS — I10 Essential (primary) hypertension: Secondary | ICD-10-CM | POA: Diagnosis not present

## 2016-03-13 DIAGNOSIS — IMO0001 Reserved for inherently not codable concepts without codable children: Secondary | ICD-10-CM

## 2016-03-13 MED ORDER — OMEPRAZOLE 40 MG PO CPDR: 40.0000 mg | DELAYED_RELEASE_CAPSULE | Freq: Every day | ORAL | 3 refills | Status: DC

## 2016-03-13 MED ORDER — LOSARTAN POTASSIUM 100 MG PO TABS: 100.0000 mg | ORAL_TABLET | Freq: Every day | ORAL | 3 refills | Status: DC

## 2016-03-13 MED ORDER — HYDROCHLOROTHIAZIDE 25 MG PO TABS: 25.0000 mg | ORAL_TABLET | Freq: Every day | ORAL | 3 refills | Status: DC

## 2016-03-13 NOTE — Patient Instructions (Signed)
Plan to have TSH run on the low side to inhibit thyroid nodules

## 2016-03-13 NOTE — Progress Notes (Signed)
Name: Ashley Clarke   MRN: [id number]    DOB: 1975-01-16   Date:03/13/2016       Progress Note  Subjective  Chief Complaint  Chief Complaint  Patient presents with  . Hypertension  . Hypothyroidism    HPI Here for f/u of HBP and Hyothyroidiism.  Another thyroid nodule has been found and biopsied .  Neg for cancer.  She is feeling well overall.  She had lost some weight but recently gained it back.  No problem-specific Assessment & Plan notes found for this encounter.   Past Medical History:  Diagnosis Date  . Hyperlipidemia   . Hypertension   . Hypothyroidism   . PONV (postoperative nausea and vomiting)     Past Surgical History:  Procedure Laterality Date  . ABDOMINAL HYSTERECTOMY    . APPENDECTOMY    . DG GALL BLADDER    . KNEE SURGERY    . TONSILLECTOMY      Family History  Problem Relation Age of Onset  . Stroke Paternal Aunt   . Stroke Maternal Grandmother     Social History   Social History  . Marital status: Divorced    Spouse name: N/A  . Number of children: N/A  . Years of education: N/A   Occupational History  . Not on file.   Social History Main Topics  . Smoking status: Current Every Day Smoker    Packs/day: 1.00    Types: Cigarettes  . Smokeless tobacco: Never Used  . Alcohol use No  . Drug use: No  . Sexual activity: Not on file   Other Topics Concern  . Not on file   Social History Narrative  . No narrative on file     Current Outpatient Prescriptions:  .  albuterol (PROAIR HFA) 108 (90 BASE) MCG/ACT inhaler, Inhale 2 puffs into the lungs every 6 (six) hours as needed. , Disp: , Rfl:  .  Alpha-Lipoic Acid 600 MG CAPS, Take 1 capsule (600 mg total) by mouth daily after supper., Disp: 120 each, Rfl: 12 .  etodolac (LODINE) 500 MG tablet, Take 500 mg by mouth 2 (two) times daily., Disp: , Rfl:  .  fluticasone (FLONASE) 50 MCG/ACT nasal spray, Place 2 sprays into both nostrils as needed. , Disp: , Rfl:  .  hydrochlorothiazide  (HYDRODIURIL) 25 MG tablet, Take 1 tablet (25 mg total) by mouth daily., Disp: 90 tablet, Rfl: 3 .  levothyroxine (SYNTHROID, LEVOTHROID) 112 MCG tablet, Take 1 tablet by mouth  daily, Disp: 90 tablet, Rfl: 3 .  losartan (COZAAR) 100 MG tablet, Take 1 tablet (100 mg total) by mouth daily., Disp: 90 tablet, Rfl: 3 .  omeprazole (PRILOSEC) 40 MG capsule, Take 1 capsule (40 mg total) by mouth daily., Disp: 90 capsule, Rfl: 3  Not on File   Review of Systems  Constitutional: Negative for chills, fever, malaise/fatigue and weight loss.  HENT: Negative for congestion, hearing loss, sore throat and tinnitus.   Eyes: Negative for blurred vision and double vision.  Respiratory: Negative for cough, shortness of breath and wheezing.   Cardiovascular: Negative for chest pain, palpitations and leg swelling.  Gastrointestinal: Negative for abdominal pain, blood in stool and heartburn.  Genitourinary: Negative for dysuria, frequency and urgency.  Musculoskeletal: Negative for joint pain and myalgias.  Skin: Negative for rash.  Neurological: Negative for dizziness, tingling, tremors, weakness and headaches.      Objective  Vitals:   03/13/16 0857  BP: 140/87  Pulse: 82  Resp:  16  Temp: 98.4 F (36.9 C)  TempSrc: Oral  Weight: 284 lb (128.8 kg)  Height: 5\' 8"  (1.727 m)    Physical Exam  Constitutional: She is oriented to person, place, and time and well-developed, well-nourished, and in no distress. No distress.  HENT:  Head: Normocephalic and atraumatic.  Eyes: Conjunctivae and EOM are normal. Pupils are equal, round, and reactive to light. No scleral icterus.  Neck: Normal range of motion. Neck supple. Carotid bruit is not present. Thyroid mass (R thyroid nodules) present. No thyromegaly present.  Cardiovascular: Normal rate, regular rhythm and normal heart sounds.  Exam reveals no gallop and no friction rub.   No murmur heard. Pulmonary/Chest: Effort normal and breath sounds normal. No  respiratory distress. She has no wheezes. She has no rales.  Abdominal: Soft. Bowel sounds are normal. She exhibits no distension and no mass. There is no tenderness.  Musculoskeletal: She exhibits no edema.  Lymphadenopathy:    She has no cervical adenopathy.  Neurological: She is alert and oriented to person, place, and time.  Vitals reviewed.      Recent Results (from the past 2160 hour(s))  Cytology - Non PAP; right thyroid lobe     Status: None   Collection Time: 02/13/16  4:11 PM  Result Value Ref Range   CYTOLOGY - NON GYN      Cytology - Non PAP CASE: [id number] PATIENT: Ashley Clarke Non-Gyn Cytology Report     SPECIMEN SUBMITTED: A. Thyroid, right upper pole, nodule 1; FNA B. Thyroid, right lower pole, nodule 2; FNA  CLINICAL HISTORY: Multinodular goiter with new and enlarged nodules  PRE-OPERATIVE DIAGNOSIS: None provided  POST-OPERATIVE DIAGNOSIS: None provided.     DIAGNOSIS: A. THYROID NODULE 1, RIGHT UPPER POLE; ULTRASOUND GUIDED FNA: - CONSISTENT WITH LYMPHOCYTIC THYROIDITIS (BETHESDA CATEGORY II).  B. THYROID NODULE 2, RIGHT LOWER POLE; ULTRASOUND GUIDED FNA: - CONSISTENT WITH LYMPHOCYTIC THYROIDITIS (BETHESDA CATEGORY II).    GROSS DESCRIPTION:  A. Site: right thyroid upper pole nodule #1 Procedure: ultrasound Cytotechnologist: Ashley Clarke Specimen(s) collected: 4 Diff Quik stained slides 4 Pap stained slides Specimen labeled right thyroid upper pole :      Description: pink CytoLyt solution      Submitted for:           [facility]           Afirma: [id number]  B. Site: right thyroid lower pole nodule #2 Procedure: ultrasound Cytotechnologist: Ashley Clarke Specimen(s) collected: 4 Diff Quik stained slides 4  Pap stained slides Specimen labeled right thyroid lower pole :      Description: clear CytoLyt solution      Submitted for:           [facility]           [id number]   Final Diagnosis performed by Ashley Burow, MD.  Electronically signed 02/14/2016 3:36:38PM    The electronic signature indicates that the named Attending Pathologist has evaluated the specimen  Technical component performed at [facility], [address], [place], [place] [address] Lab: [phone] Dir: Ashley Clarke. Ashley Doffing, MD  Professional component performed at [facility], [facility], [address], [place], [place] [address] Lab: [phone] Dir: Dellia Nims. Reuel Derby, MD       Assessment & Plan  Problem List Items Addressed This Visit      Cardiovascular and Mediastinum   Essential (primary) hypertension - Primary   Relevant Medications   losartan (COZAAR) 100 MG tablet   hydrochlorothiazide (HYDRODIURIL)  25 MG tablet   Other Relevant Orders   COMPLETE METABOLIC PANEL WITH GFR   Lipid Profile     Respiratory   AB (asthmatic bronchitis)     Digestive   GERD (gastroesophageal reflux disease)   Relevant Medications   omeprazole (PRILOSEC) 40 MG capsule   Other Relevant Orders   CBC with Differential     Endocrine   Multinodular goiter   Relevant Orders   TSH     Other   Obesity      Meds ordered this encounter  Medications  . losartan (COZAAR) 100 MG tablet    Sig: Take 1 tablet (100 mg total) by mouth daily.    Dispense:  90 tablet    Refill:  3  . hydrochlorothiazide (HYDRODIURIL) 25 MG tablet    Sig: Take 1 tablet (25 mg total) by mouth daily.    Dispense:  90 tablet    Refill:  3  . omeprazole (PRILOSEC) 40 MG capsule    Sig: Take 1 capsule (40 mg total) by mouth daily.    Dispense:  90 capsule    Refill:  3   1. Essential (primary) hypertension  - losartan (COZAAR) 100 MG tablet; Take 1 tablet (100 mg total) by mouth daily.  Dispense: 90 tablet; Refill: 3 - hydrochlorothiazide (HYDRODIURIL) 25 MG tablet; Take 1 tablet (25 mg total) by mouth daily.  Dispense: 90 tablet; Refill: 3 - COMPLETE METABOLIC PANEL WITH GFR - Lipid Profile  2. Mild intermittent asthmatic  bronchitis without complication   3. Multinodular goiter Cont same dose of Levothyroxine for now. - TSH  4. Class 3 obesity due to excess calories without serious comorbidity in adult, unspecified BMI Discussed weight loss again.  5. Gastroesophageal reflux disease without esophagitis  - omeprazole (PRILOSEC) 40 MG capsule; Take 1 capsule (40 mg total) by mouth daily.  Dispense: 90 capsule; Refill: 3 - CBC with Differential

## 2016-03-14 ENCOUNTER — Other Ambulatory Visit: Payer: Managed Care, Other (non HMO)

## 2016-03-14 LAB — CBC WITH DIFFERENTIAL/PLATELET
BASOS ABS: 0 {cells}/uL (ref 0–200)
Basophils Relative: 0 %
EOS ABS: 186 {cells}/uL (ref 15–500)
EOS PCT: 2 %
HCT: 43.9 % (ref 35.0–45.0)
Hemoglobin: 14.5 g/dL (ref 11.7–15.5)
LYMPHS PCT: 27 %
Lymphs Abs: 2511 cells/uL (ref 850–3900)
MCH: 29.6 pg (ref 27.0–33.0)
MCHC: 33 g/dL (ref 32.0–36.0)
MCV: 89.6 fL (ref 80.0–100.0)
MPV: 10.1 fL (ref 7.5–12.5)
Monocytes Absolute: 465 cells/uL (ref 200–950)
Monocytes Relative: 5 %
NEUTROS PCT: 66 %
Neutro Abs: 6138 cells/uL (ref 1500–7800)
PLATELETS: 291 10*3/uL (ref 140–400)
RBC: 4.9 MIL/uL (ref 3.80–5.10)
RDW: 13.8 % (ref 11.0–15.0)
WBC: 9.3 10*3/uL (ref 3.8–10.8)

## 2016-03-14 LAB — TSH: TSH: 4.7 m[IU]/L — AB

## 2016-03-15 ENCOUNTER — Other Ambulatory Visit: Payer: Self-pay | Admitting: Family Medicine

## 2016-03-15 DIAGNOSIS — E039 Hypothyroidism, unspecified: Secondary | ICD-10-CM

## 2016-03-15 DIAGNOSIS — E876 Hypokalemia: Secondary | ICD-10-CM

## 2016-03-15 LAB — LIPID PANEL
CHOLESTEROL: 184 mg/dL (ref <200)
HDL: 35 mg/dL — ABNORMAL LOW (ref >50)
LDL Cholesterol: 127 mg/dL — ABNORMAL HIGH (ref <100)
Total CHOL/HDL Ratio: 5.3 Ratio — ABNORMAL HIGH (ref <5.0)
Triglycerides: 111 mg/dL (ref <150)
VLDL: 22 mg/dL (ref <30)

## 2016-03-15 LAB — COMPLETE METABOLIC PANEL WITH GFR
ALBUMIN: 4.2 g/dL (ref 3.6–5.1)
ALK PHOS: 67 U/L (ref 33–115)
ALT: 28 U/L (ref 6–29)
AST: 21 U/L (ref 10–30)
BILIRUBIN TOTAL: 1.2 mg/dL (ref 0.2–1.2)
BUN: 9 mg/dL (ref 7–25)
CO2: 21 mmol/L (ref 20–31)
CREATININE: 0.81 mg/dL (ref 0.50–1.10)
Calcium: 9.5 mg/dL (ref 8.6–10.2)
Chloride: 101 mmol/L (ref 98–110)
GFR, Est African American: >89 mL/min (ref >=60)
GLUCOSE: 106 mg/dL — AB (ref 65–99)
Potassium: 3.3 mmol/L — ABNORMAL LOW (ref 3.5–5.3)
SODIUM: 139 mmol/L (ref 135–146)
TOTAL PROTEIN: 7.4 g/dL (ref 6.1–8.1)

## 2016-03-15 LAB — HEMOGLOBIN A1C
Hgb A1c MFr Bld: 5.3 % (ref <5.7)
MEAN PLASMA GLUCOSE: 105 mg/dL

## 2016-03-15 MED ORDER — LEVOTHYROXINE SODIUM 125 MCG PO TABS: 125.0000 ug | ORAL_TABLET | Freq: Every day | ORAL | 3 refills | Status: DC

## 2016-03-15 MED ORDER — POTASSIUM CHLORIDE ER 10 MEQ PO TBCR: 10.0000 meq | EXTENDED_RELEASE_TABLET | Freq: Every day | ORAL | 6 refills | Status: DC

## 2016-05-11 ENCOUNTER — Ambulatory Visit: Payer: Managed Care, Other (non HMO)

## 2016-05-16 ENCOUNTER — Ambulatory Visit
Admission: RE | Admit: 2016-05-16 | Discharge: 2016-05-16 | Disposition: A | Discharge status: Home / Self Care | Payer: Managed Care, Other (non HMO) | Source: Ambulatory Visit | Attending: Unknown Physician Specialty | Admitting: Unknown Physician Specialty

## 2016-05-16 DIAGNOSIS — I889 Nonspecific lymphadenitis, unspecified: Secondary | ICD-10-CM | POA: Insufficient documentation

## 2016-05-17 ENCOUNTER — Other Ambulatory Visit: Payer: Self-pay | Admitting: Unknown Physician Specialty

## 2016-05-17 DIAGNOSIS — I889 Nonspecific lymphadenitis, unspecified: Secondary | ICD-10-CM

## 2016-06-08 ENCOUNTER — Ambulatory Visit: Payer: Managed Care, Other (non HMO) | Admitting: Family Medicine

## 2016-07-23 ENCOUNTER — Other Ambulatory Visit: Payer: Self-pay | Admitting: Unknown Physician Specialty

## 2016-07-23 DIAGNOSIS — E041 Nontoxic single thyroid nodule: Secondary | ICD-10-CM

## 2016-08-29 ENCOUNTER — Encounter: Payer: Self-pay | Admitting: Family Medicine

## 2016-08-29 ENCOUNTER — Ambulatory Visit (INDEPENDENT_AMBULATORY_CARE_PROVIDER_SITE_OTHER): Payer: Managed Care, Other (non HMO) | Admitting: Family Medicine

## 2016-08-29 VITALS — BP 127/59 | HR 64 | Temp 98.3°F | Resp 16 | Ht 68.0 in | Wt 280.0 lb

## 2016-08-29 DIAGNOSIS — E039 Hypothyroidism, unspecified: Secondary | ICD-10-CM | POA: Diagnosis not present

## 2016-08-29 DIAGNOSIS — Z6841 Body Mass Index (BMI) 40.0 and over, adult: Secondary | ICD-10-CM

## 2016-08-29 DIAGNOSIS — E042 Nontoxic multinodular goiter: Secondary | ICD-10-CM

## 2016-08-29 DIAGNOSIS — I10 Essential (primary) hypertension: Secondary | ICD-10-CM

## 2016-08-29 NOTE — Patient Instructions (Addendum)
Thank you for coming to the clinic today.  1. Labs ordered for [facility] - TSH / Free T4 for thyroid function, now 5 months on 146mcg - Will release to MyChart with advice  2. Keep track of BP - Continue healthy lifestyle plan as discussed  DUE for FASTING BLOOD WORK (no food or drink after midnight before the lab appointment, only water or coffee without cream/sugar on the morning of)  [facility] in 6 months (SEND MESSAGE OR CALL BEFORE YOU GO TO GET BLOOD WORK, 1-2 days before) we can confirm your orders in our system   - Make sure Lab Only appointment is at about 1 week before your next appointment, so that results will be available  For Lab Results, once available within 2-3 days of blood draw, you can can log in to MyChart online to view your results and a brief explanation. Also, we can discuss results at next follow-up visit.  Please schedule a Follow-up Appointment to: Return in about 6 months (around 03/01/2017) for Annual Physical.  If you have any other questions or concerns, please feel free to call the clinic or send a message through [facility]. You may also schedule an earlier appointment if necessary.  Additionally, you may be receiving a survey about your experience at our clinic within a few days to 1 week by e-mail or mail. We value your feedback.  Nobie Putnam, DO [place]

## 2016-08-29 NOTE — Assessment & Plan Note (Signed)
Improved wt loss, on lifestyle changes, needs to start exercise / gym Controlled HTN and hypothyroidism Encourage continued lifestyle improvements Follow-up as planned 6 months, labs, will review biometrics

## 2016-08-29 NOTE — Assessment & Plan Note (Signed)
Well-controlled HTN - Home BP readings none, but can start checking  No known complications   Plan:  1. Continue current BP regimen - Losartan 100mg  daily, HCTZ 25mg  daily (also for edema) 2. Encourage improved lifestyle - low sodium diet, start regular exercise 3. Start monitor BP outside office, bring readings to next visit, if persistently >140/90 or new symptoms notify office sooner 4. Follow-up 6 months, will review biometrics and future labs - if BP improves with wt loss consider taper down Losartan vs HCTZ

## 2016-08-29 NOTE — Assessment & Plan Note (Addendum)
History of multinodular goiter, followed by ENT Tami Ribas for neck US and biopsies, negative in past, reported 3 nodules on R and 2 on L  Plan: 1. Check [facility] TSH and Free T4, should get drawn tomorrow with biometrics, and adjust levo accordingly, prefer to not change dose if possible 2. Continue Levothyroxine 176mcg daily for now, has refills 3. Follow-up then again 6 months likely repeat TSH 4. Follow-up with [facility] 04/2017 for [place] and exam

## 2016-08-29 NOTE — Assessment & Plan Note (Signed)
Clinically stable, controlled hypothyroidism on levothyroxine Last TSH 4.7 (02/2016) with inc dose levo 882 to [id number] Complicated by multinodular goiter, followed by ENT Ashley Clarke for neck US and biopsies, negative in past, reported 3 nodules on R and 2 on L  Plan: 1. Check [facility] TSH and Free T4, should get drawn tomorrow with biometrics, and adjust levo accordingly, prefer to not change dose if possible 2. Continue Levothyroxine 137mcg daily for now, has refills 3. Follow-up then again 6 months likely repeat TSH

## 2016-08-29 NOTE — Progress Notes (Signed)
Subjective:    Patient ID: Ashley Clarke, female    DOB: 05/02/1975, 41 y.o.   MRN: [id number]  Ashley Clarke is a 41 y.o. female presenting on 08/29/2016 for Hypertension   HPI   CHRONIC HTN: Reports checks BP occasionally, parents have a BP cuff can borrow theirs. She had bad reaction intolerance to Lisinopril in past. Current Meds - Losartan 100mg  daily, HCTZ 25mg  daily Reports good compliance, took meds today. Tolerating well, w/o complaints. - Admits occasional lower extremity edema, improved on thiazide Denies CP, dyspnea, HA, edema, dizziness / lightheadedness  Hypothyroidism / Multinodular Goiter - Reports prior history of hypothyroidism, and in past prior PCP identified thyroid nodule on exam palpation, and was sent for [place] evaluation, ultimately has been managed with multinodular goiter over past few years by PCP and ENT Dr Tami Ribas. Has had several neck US and up to 3-4 thyroid nodule biopsies, negative, last visit to see ENT 2 months ago, stable nodule, now just following up next in April 2019 with [place] - Reports that previously PCP had increased dose of Levothyroxine 153mcg in March 2018, from previously 159mcg, goal to reduce frequency of goiter/nodules with higher dose levothyroxine, tolerating well - Denies temperature imbalance, hair or nail changes, worsening weight gain, energy changes  Morbid Obesity BMI >42 - Reports overall seems to be improved, weight down 4 lbs in 6 months with lifestyle changes. Overall has had weight gain in past 1-2 years. Lifestyle: - Diet: Reduced junk foods, and drinking more water. - Exercise: no regular exercise at this time, but plans to join [employer] and start some cardio exercises   Social History  Substance Use Topics  . Smoking status: Current Every Day Smoker    Packs/day: 1.00    Types: Cigarettes  . Smokeless tobacco: Current User  . Alcohol use No    Review of Systems Per HPI unless specifically indicated  above     Objective:    BP (!) 127/59   Pulse 64   Temp 98.3 F (36.8 C) (Oral)   Resp 16   Ht 5\' 8"  (1.727 m)   Wt 280 lb (127 kg)   BMI 42.57 kg/m   Wt Readings from Last 3 Encounters:  08/29/16 280 lb (127 kg)  03/13/16 284 lb (128.8 kg)  02/13/16 265 lb (120.2 kg)    Physical Exam  Constitutional: She is oriented to person, place, and time. She appears well-developed and well-nourished. No distress.  Well-appearing, comfortable, cooperative, obese  HENT:  Head: Normocephalic and atraumatic.  Mouth/Throat: Oropharynx is clear and moist.  Eyes: Conjunctivae are normal. Right eye exhibits no discharge. Left eye exhibits no discharge.  Neck: Normal range of motion. Neck supple. Thyromegaly (Mild generalized symmetrical, no significant increased nodularity palpated today, non tender) present.  Cardiovascular: Normal rate, regular rhythm, normal heart sounds and intact distal pulses.   No murmur heard. Pulmonary/Chest: Effort normal.  Musculoskeletal: Normal range of motion. She exhibits no edema.  Lymphadenopathy:    She has no cervical adenopathy.  Neurological: She is alert and oriented to person, place, and time.  Skin: Skin is warm and dry. No rash noted. She is not diaphoretic. No erythema.  Psychiatric: She has a normal mood and affect. Her behavior is normal.  Well groomed, good eye contact, normal speech and thoughts  Nursing note and vitals reviewed.    Results for orders placed or performed in visit on 03/13/16  Hemoglobin A1c  Result Value Ref Range  Hgb A1c MFr Bld 5.3 <5.7 %   Mean Plasma Glucose 105 mg/dL      Assessment & Plan:   Problem List Items Addressed This Visit    Multinodular goiter    History of multinodular goiter, followed by ENT Tami Ribas for neck US and biopsies, negative in past, reported 3 nodules on R and 2 on L  Plan: 1. Check [facility] TSH and Free T4, should get drawn tomorrow with biometrics, and adjust levo accordingly, prefer to  not change dose if possible 2. Continue Levothyroxine 124mcg daily for now, has refills 3. Follow-up then again 6 months likely repeat TSH 4. Follow-up with [facility] 04/2017 for [place] and exam      Relevant Orders   TSH   T4, free   Morbid obesity with BMI of 40.0-44.9, adult ([place])    Improved wt loss, on lifestyle changes, needs to start exercise / gym Controlled HTN and hypothyroidism Encourage continued lifestyle improvements Follow-up as planned 6 months, labs, will review biometrics      Essential (primary) hypertension    Well-controlled HTN - Home BP readings none, but can start checking  No known complications   Plan:  1. Continue current BP regimen - Losartan 100mg  daily, HCTZ 25mg  daily (also for edema) 2. Encourage improved lifestyle - low sodium diet, start regular exercise 3. Start monitor BP outside office, bring readings to next visit, if persistently >140/90 or new symptoms notify office sooner 4. Follow-up 6 months, will review biometrics and future labs - if BP improves with wt loss consider taper down Losartan vs HCTZ      Adult hypothyroidism - Primary    Clinically stable, controlled hypothyroidism on levothyroxine Last TSH 4.7 (02/2016) with inc dose levo 629 to [id number] Complicated by multinodular goiter, followed by ENT Tami Ribas for neck US and biopsies, negative in past, reported 3 nodules on R and 2 on L  Plan: 1. Check [facility] TSH and Free T4, should get drawn tomorrow with biometrics, and adjust levo accordingly, prefer to not change dose if possible 2. Continue Levothyroxine 147mcg daily for now, has refills 3. Follow-up then again 6 months likely repeat TSH      Relevant Orders   TSH   T4, free      No orders of the defined types were placed in this encounter.   Follow up plan: Return in about 6 months (around 03/01/2017) for Annual Physical.  Patient to get [facility] biometric screening labs done this week, submit to me for review, and then we  will place future orders for [facility] routine physical labs in 02/2017.  Nobie Putnam, [facility] 08/29/2016, 10:52 AM

## 2016-09-04 LAB — TSH: TSH: 2.84 u[IU]/mL (ref 0.450–4.500)

## 2016-09-04 LAB — T4, FREE: FREE T4: 1.25 ng/dL (ref 0.82–1.77)

## 2016-09-13 ENCOUNTER — Other Ambulatory Visit: Payer: Self-pay | Admitting: Family Medicine

## 2016-09-13 DIAGNOSIS — E785 Hyperlipidemia, unspecified: Secondary | ICD-10-CM | POA: Insufficient documentation

## 2016-09-13 DIAGNOSIS — Z0000 Encounter for general adult medical examination without abnormal findings: Secondary | ICD-10-CM

## 2016-09-13 DIAGNOSIS — E042 Nontoxic multinodular goiter: Secondary | ICD-10-CM

## 2016-09-13 DIAGNOSIS — E782 Mixed hyperlipidemia: Secondary | ICD-10-CM

## 2016-09-13 DIAGNOSIS — Z6841 Body Mass Index (BMI) 40.0 and over, adult: Secondary | ICD-10-CM

## 2016-09-13 DIAGNOSIS — R799 Abnormal finding of blood chemistry, unspecified: Secondary | ICD-10-CM

## 2016-09-13 DIAGNOSIS — R7309 Other abnormal glucose: Secondary | ICD-10-CM

## 2016-09-13 DIAGNOSIS — I10 Essential (primary) hypertension: Secondary | ICD-10-CM

## 2016-09-13 DIAGNOSIS — E039 Hypothyroidism, unspecified: Secondary | ICD-10-CM

## 2016-10-30 ENCOUNTER — Encounter: Payer: Self-pay | Admitting: Family Medicine

## 2016-10-30 ENCOUNTER — Ambulatory Visit (INDEPENDENT_AMBULATORY_CARE_PROVIDER_SITE_OTHER): Payer: Managed Care, Other (non HMO) | Admitting: Family Medicine

## 2016-10-30 VITALS — BP 124/75 | HR 72 | Temp 98.1°F | Resp 16 | Ht 68.0 in | Wt 280.0 lb

## 2016-10-30 DIAGNOSIS — Z720 Tobacco use: Secondary | ICD-10-CM | POA: Insufficient documentation

## 2016-10-30 DIAGNOSIS — Z6841 Body Mass Index (BMI) 40.0 and over, adult: Secondary | ICD-10-CM

## 2016-10-30 NOTE — Assessment & Plan Note (Signed)
Stable weight today, previous wt loss, now improving lifestyle Goal BMI < 30 for work biometrics Improved hypothyroidism control  Plan: 1. Joined gym for strength training weight machines and cardio inc regular exercise 2. Improve healthy diet, less sweets/junk food smaller portions, inc vegetables more salads, inc water intake, also will do a dietary supplement program for 30 days 3. Follow-up February 2019 for annual + labs

## 2016-10-30 NOTE — Progress Notes (Signed)
Subjective:    Patient ID: Ashley Clarke, female    DOB: [birth year], 41 y.o.   MRN: [id number]  Ashley Clarke is a 41 y.o. female presenting on 10/30/2016 for biometric screening; Obesity; and Nicotine Dependence   HPI   Morbid Obesity BMI >42 - Here today for work BMI metric health screen form today, to discuss health plan to lose weight, goal BMI < 30, she has lost 4 lbs in 6 months. She had biometric labs done through [facility] but no results available today - Additionally she works for [employer] and has plans to try a "healthy nutritional supplement" that can help "detox" and help her body, she will try this for 30 days  Lifestyle: - Diet: Reduced portion size for junk foods and sweets, increase vegetables salads, more baked less fried  - Exercise: Now joined [employer] and plans to go more regularly to work on   Tobacco Abuse: - Active smoker, previously quit temporarily when pregnant, only quit for up to few months otherwise, not tried any medications or NRT. Not ready to quit at this time. Currently smoking < 0.5ppd, intermittently for 30 years  Health Maintenance: - Due for Flu Shot, will get through work instead - Due TDap, will defer today and consider at annual physical  Depression screen [facility] 2/9 10/30/2016 08/29/2016 08/01/2015  Decreased Interest 0 0 0  Down, Depressed, Hopeless 0 0 0  PHQ - 2 Score 0 0 0    Past Medical History:  Diagnosis Date  . Hyperlipidemia   . Hypertension   . Hypothyroidism   . PONV (postoperative nausea and vomiting)    Past Surgical History:  Procedure Laterality Date  . ABDOMINAL HYSTERECTOMY    . APPENDECTOMY    . DG GALL BLADDER    . KNEE SURGERY    . TONSILLECTOMY     Social History   Social History  . Marital status: Divorced    Spouse name: N/A  . Number of children: N/A  . Years of education: N/A   Occupational History  . [employer] Employee    Social History Main Topics  . Smoking status: Current Every Day  Smoker    Packs/day: 0.25    Years: 30.00    Types: Cigarettes  . Smokeless tobacco: Current User     Comment: < 0.5ppd avg smoking  . Alcohol use No  . Drug use: No  . Sexual activity: Not on file   Other Topics Concern  . Not on file   Social History Narrative  . No narrative on file   Family History  Problem Relation Age of Onset  . Stroke Paternal Aunt   . Stroke Maternal Grandmother    Current Outpatient Prescriptions on File Prior to Visit  Medication Sig  . albuterol (PROAIR HFA) 108 (90 BASE) MCG/ACT inhaler Inhale 2 puffs into the lungs every 6 (six) hours as needed.   . Alpha-Lipoic Acid 600 MG CAPS Take 1 capsule (600 mg total) by mouth daily after supper.  . hydrochlorothiazide (HYDRODIURIL) 25 MG tablet Take 1 tablet (25 mg total) by mouth daily.  Marland Kitchen levothyroxine (SYNTHROID, LEVOTHROID) 125 MCG tablet Take 1 tablet (125 mcg total) by mouth daily.  Marland Kitchen losartan (COZAAR) 100 MG tablet Take 1 tablet (100 mg total) by mouth daily.  Marland Kitchen omeprazole (PRILOSEC) 40 MG capsule Take 1 capsule (40 mg total) by mouth daily.  . potassium chloride (K-DUR) 10 MEQ tablet Take 1 tablet (10 mEq total) by mouth daily.  Marland Kitchen  etodolac (LODINE) 500 MG tablet Take 500 mg by mouth 2 (two) times daily.  . fluticasone (FLONASE) 50 MCG/ACT nasal spray Place 2 sprays into both nostrils as needed.    No current facility-administered medications on file prior to visit.     Review of Systems Per HPI unless specifically indicated above     Objective:    BP 124/75   Pulse 72   Temp 98.1 F (36.7 C) (Oral)   Resp 16   Ht 5\' 8"  (1.727 m)   Wt 280 lb (127 kg)   BMI 42.57 kg/m   Wt Readings from Last 3 Encounters:  10/30/16 280 lb (127 kg)  08/29/16 280 lb (127 kg)  03/13/16 284 lb (128.8 kg)    Physical Exam  Constitutional: She is oriented to person, place, and time. She appears well-developed and well-nourished. No distress.  Well-appearing, comfortable, cooperative, obese  HENT:    Head: Normocephalic and atraumatic.  Mouth/Throat: Oropharynx is clear and moist.  Eyes: Conjunctivae are normal. Right eye exhibits no discharge. Left eye exhibits no discharge.  Cardiovascular: Normal rate, regular rhythm, normal heart sounds and intact distal pulses.   No murmur heard. Pulmonary/Chest: Effort normal and breath sounds normal. No respiratory distress. She has no wheezes. She has no rales.  Musculoskeletal: Normal range of motion. She exhibits no edema.  Neurological: She is alert and oriented to person, place, and time.  Skin: Skin is warm and dry. No rash noted. She is not diaphoretic. No erythema.  Psychiatric: She has a normal mood and affect. Her behavior is normal.  Well groomed, good eye contact, normal speech and thoughts  Nursing note and vitals reviewed.      Assessment & Plan:   Problem List Items Addressed This Visit    Morbid obesity with BMI of 40.0-44.9, adult ([place]) - Primary    Stable weight today, previous wt loss, now improving lifestyle Goal BMI < 30 for work biometrics Improved hypothyroidism control  Plan: 1. Joined gym for strength training weight machines and cardio inc regular exercise 2. Improve healthy diet, less sweets/junk food smaller portions, inc vegetables more salads, inc water intake, also will do a dietary supplement program for 30 days 3. Follow-up February 2019 for annual + labs      Tobacco abuse    Active smoker < 0.5ppd, not ready to quit today Never on NRT or medicines, will consider rx meds Goal to self wean smoking if able, when mentally ready. Handout for Quitline given  Discussion today >5 minutes (<10 minutes) specifically on counseling on risks of tobacco use, complications, treatment, smoking cessation.         Biometric work screening form for failed measure BMI and Tobacco Use completed today with written plan to correct and improve on these items. Form copied and given back to patient today.  No orders of  the defined types were placed in this encounter.   Follow up plan: Return in about 4 months (around 03/01/2017) for Annual physical as scheduled already.  Future labs for [facility] already entered into system from previous visit and ready and available for February 2019, patient to notify us to release orders for [place], [facility] 10/30/2016, 9:02 AM

## 2016-10-30 NOTE — Assessment & Plan Note (Signed)
Active smoker < 0.5ppd, not ready to quit today Never on NRT or medicines, will consider rx meds Goal to self wean smoking if able, when mentally ready. Handout for Quitline given  Discussion today >5 minutes (<10 minutes) specifically on counseling on risks of tobacco use, complications, treatment, smoking cessation.

## 2016-10-30 NOTE — Patient Instructions (Addendum)
Thank you for coming to the clinic today.  1.  BMI goal, keep up the good work on increasing exercise at gym, reduced portion size, vegetable servings  2. For smoking - Try to self taper down on smoking and quit if able, when ready  Try 1 800-QUIT NOW, can offer free counseling support and Nicotine replacement therapy  In future I do recommend trial on Chantix or Wellbutrin  Please schedule a Follow-up Appointment to: Return in about 4 months (around 03/01/2017) for Annual physical as scheduled already.  If you have any other questions or concerns, please feel free to call the clinic or send a message through [place]. You may also schedule an earlier appointment if necessary.  Additionally, you may be receiving a survey about your experience at our clinic within a few days to 1 week by e-mail or mail. We value your feedback.  Nobie Putnam, DO [place]

## 2017-02-20 ENCOUNTER — Ambulatory Visit
Admission: RE | Admit: 2017-02-20 | Discharge: 2017-02-20 | Disposition: A | Discharge status: Home / Self Care | Payer: Managed Care, Other (non HMO) | Source: Ambulatory Visit | Attending: Unknown Physician Specialty | Admitting: Unknown Physician Specialty

## 2017-02-20 ENCOUNTER — Other Ambulatory Visit: Payer: Self-pay | Admitting: Unknown Physician Specialty

## 2017-02-20 DIAGNOSIS — L040 Acute lymphadenitis of face, head and neck: Secondary | ICD-10-CM | POA: Insufficient documentation

## 2017-02-20 DIAGNOSIS — I889 Nonspecific lymphadenitis, unspecified: Secondary | ICD-10-CM | POA: Diagnosis present

## 2017-02-20 DIAGNOSIS — E041 Nontoxic single thyroid nodule: Secondary | ICD-10-CM

## 2017-02-20 DIAGNOSIS — E042 Nontoxic multinodular goiter: Secondary | ICD-10-CM | POA: Insufficient documentation

## 2017-02-28 ENCOUNTER — Telehealth: Payer: Self-pay | Admitting: Family Medicine

## 2017-02-28 ENCOUNTER — Other Ambulatory Visit: Payer: Self-pay

## 2017-02-28 DIAGNOSIS — R799 Abnormal finding of blood chemistry, unspecified: Secondary | ICD-10-CM

## 2017-02-28 DIAGNOSIS — I10 Essential (primary) hypertension: Secondary | ICD-10-CM

## 2017-02-28 DIAGNOSIS — E039 Hypothyroidism, unspecified: Secondary | ICD-10-CM

## 2017-02-28 DIAGNOSIS — E042 Nontoxic multinodular goiter: Secondary | ICD-10-CM

## 2017-02-28 DIAGNOSIS — E782 Mixed hyperlipidemia: Secondary | ICD-10-CM

## 2017-02-28 DIAGNOSIS — R7309 Other abnormal glucose: Secondary | ICD-10-CM

## 2017-02-28 DIAGNOSIS — Z0000 Encounter for general adult medical examination without abnormal findings: Secondary | ICD-10-CM

## 2017-02-28 NOTE — Telephone Encounter (Signed)
Pt will be in on March 1st for physical and will be doing labs prior to appt at [facility].  She wanted to make sure the order was available to [facility] [phone]

## 2017-03-01 ENCOUNTER — Encounter: Payer: Managed Care, Other (non HMO) | Admitting: Family Medicine

## 2017-03-14 ENCOUNTER — Other Ambulatory Visit: Payer: Self-pay

## 2017-03-14 DIAGNOSIS — I10 Essential (primary) hypertension: Secondary | ICD-10-CM

## 2017-03-14 MED ORDER — HYDROCHLOROTHIAZIDE 25 MG PO TABS: 25.0000 mg | ORAL_TABLET | Freq: Every day | ORAL | 1 refills | Status: DC

## 2017-03-15 ENCOUNTER — Encounter: Payer: Managed Care, Other (non HMO) | Admitting: Family Medicine

## 2017-03-27 ENCOUNTER — Other Ambulatory Visit: Payer: Self-pay | Admitting: Family Medicine

## 2017-03-27 DIAGNOSIS — I10 Essential (primary) hypertension: Secondary | ICD-10-CM

## 2017-03-27 MED ORDER — LOSARTAN POTASSIUM 100 MG PO TABS: 100.0000 mg | ORAL_TABLET | Freq: Every day | ORAL | 1 refills | Status: DC

## 2017-04-23 ENCOUNTER — Ambulatory Visit: Payer: Managed Care, Other (non HMO)

## 2017-06-25 ENCOUNTER — Telehealth: Payer: Self-pay | Admitting: Family Medicine

## 2017-06-25 DIAGNOSIS — E782 Mixed hyperlipidemia: Secondary | ICD-10-CM

## 2017-06-25 DIAGNOSIS — E042 Nontoxic multinodular goiter: Secondary | ICD-10-CM

## 2017-06-25 DIAGNOSIS — Z6841 Body Mass Index (BMI) 40.0 and over, adult: Secondary | ICD-10-CM

## 2017-06-25 DIAGNOSIS — I10 Essential (primary) hypertension: Secondary | ICD-10-CM

## 2017-06-25 DIAGNOSIS — E039 Hypothyroidism, unspecified: Secondary | ICD-10-CM

## 2017-06-25 MED ORDER — LEVOTHYROXINE SODIUM 125 MCG PO TABS: 125.0000 ug | ORAL_TABLET | Freq: Every day | ORAL | 0 refills | Status: DC

## 2017-06-25 NOTE — Telephone Encounter (Signed)
Pt. Called requesting a refill on Levothroid called into [facility] in  [place]. Will also need a  Lab order for [facility]. Before appt  Friday June 21.

## 2017-06-25 NOTE — Telephone Encounter (Signed)
Patient had previous labs ordered for February 2019 but she did not keep her apt for annual physical.  Now I have re-ordered labs for [facility], to be drawn this week, she may come by to pick up lab printed orders from front desk to bring to [place] for her labs.  Her refill was sent for short term 30 day supply to [facility] until we get lab results back then we can discuss refill at appointment  Nobie Putnam, [facility] 06/25/2017, 12:46 PM

## 2017-07-05 ENCOUNTER — Encounter: Payer: Self-pay | Admitting: Family Medicine

## 2017-07-05 ENCOUNTER — Ambulatory Visit: Payer: Managed Care, Other (non HMO) | Admitting: Family Medicine

## 2017-07-05 VITALS — BP 126/77 | HR 83 | Temp 98.7°F | Resp 16 | Ht 68.0 in | Wt 279.0 lb

## 2017-07-05 DIAGNOSIS — Z0000 Encounter for general adult medical examination without abnormal findings: Secondary | ICD-10-CM

## 2017-07-05 DIAGNOSIS — Z720 Tobacco use: Secondary | ICD-10-CM | POA: Diagnosis not present

## 2017-07-05 DIAGNOSIS — E042 Nontoxic multinodular goiter: Secondary | ICD-10-CM

## 2017-07-05 DIAGNOSIS — K219 Gastro-esophageal reflux disease without esophagitis: Secondary | ICD-10-CM | POA: Diagnosis not present

## 2017-07-05 DIAGNOSIS — E782 Mixed hyperlipidemia: Secondary | ICD-10-CM

## 2017-07-05 DIAGNOSIS — E039 Hypothyroidism, unspecified: Secondary | ICD-10-CM

## 2017-07-05 DIAGNOSIS — I10 Essential (primary) hypertension: Secondary | ICD-10-CM

## 2017-07-05 DIAGNOSIS — Z6841 Body Mass Index (BMI) 40.0 and over, adult: Secondary | ICD-10-CM | POA: Diagnosis not present

## 2017-07-05 LAB — CBC WITH DIFFERENTIAL/PLATELET
BASOS: 0 %
Basophils Absolute: 0 10*3/uL (ref 0.0–0.2)
EOS (ABSOLUTE): 0.3 10*3/uL (ref 0.0–0.4)
EOS: 2 %
HEMATOCRIT: 42.6 % (ref 34.0–46.6)
Hemoglobin: 14.6 g/dL (ref 11.1–15.9)
IMMATURE GRANS (ABS): 0 10*3/uL (ref 0.0–0.1)
IMMATURE GRANULOCYTES: 0 %
LYMPHS: 30 %
Lymphocytes Absolute: 3.6 10*3/uL — ABNORMAL HIGH (ref 0.7–3.1)
MCH: 30.9 pg (ref 26.6–33.0)
MCHC: 34.3 g/dL (ref 31.5–35.7)
MCV: 90 fL (ref 79–97)
MONOS ABS: 0.7 10*3/uL (ref 0.1–0.9)
Monocytes: 6 %
NEUTROS PCT: 62 %
Neutrophils Absolute: 7.4 10*3/uL — ABNORMAL HIGH (ref 1.4–7.0)
PLATELETS: 303 10*3/uL (ref 150–450)
RBC: 4.72 x10E6/uL (ref 3.77–5.28)
RDW: 14.2 % (ref 12.3–15.4)
WBC: 12.1 10*3/uL — AB (ref 3.4–10.8)

## 2017-07-05 LAB — COMPREHENSIVE METABOLIC PANEL
ALBUMIN: 4.6 g/dL (ref 3.5–5.5)
ALT: 25 IU/L (ref 0–32)
AST: 19 IU/L (ref 0–40)
Albumin/Globulin Ratio: 1.4 (ref 1.2–2.2)
Alkaline Phosphatase: 77 IU/L (ref 39–117)
BUN / CREAT RATIO: 20 (ref 9–23)
BUN: 17 mg/dL (ref 6–24)
Bilirubin Total: 1.1 mg/dL (ref 0.0–1.2)
CO2: 24 mmol/L (ref 20–29)
CREATININE: 0.87 mg/dL (ref 0.57–1.00)
Calcium: 9.4 mg/dL (ref 8.7–10.2)
Chloride: 100 mmol/L (ref 96–106)
GFR calc non Af Amer: 82 mL/min/{1.73_m2} (ref >59)
GFR, EST AFRICAN AMERICAN: 94 mL/min/{1.73_m2} (ref >59)
GLOBULIN, TOTAL: 3.2 g/dL (ref 1.5–4.5)
GLUCOSE: 95 mg/dL (ref 65–99)
Potassium: 3.5 mmol/L (ref 3.5–5.2)
SODIUM: 138 mmol/L (ref 134–144)
TOTAL PROTEIN: 7.8 g/dL (ref 6.0–8.5)

## 2017-07-05 LAB — LIPID PANEL
CHOLESTEROL TOTAL: 189 mg/dL (ref 100–199)
Chol/HDL Ratio: 5.1 ratio — ABNORMAL HIGH (ref 0.0–4.4)
HDL: 37 mg/dL — ABNORMAL LOW (ref >39)
LDL CALC: 133 mg/dL — AB (ref 0–99)
TRIGLYCERIDES: 97 mg/dL (ref 0–149)
VLDL Cholesterol Cal: 19 mg/dL (ref 5–40)

## 2017-07-05 LAB — T4, FREE: Free T4: 1.06 ng/dL (ref 0.82–1.77)

## 2017-07-05 LAB — HEMOGLOBIN A1C
ESTIMATED AVERAGE GLUCOSE: 111 mg/dL
Hgb A1c MFr Bld: 5.5 % (ref 4.8–5.6)

## 2017-07-05 LAB — TSH: TSH: 13.28 u[IU]/mL — ABNORMAL HIGH (ref 0.450–4.500)

## 2017-07-05 MED ORDER — LEVOTHYROXINE SODIUM 125 MCG PO TABS: 125.0000 ug | ORAL_TABLET | Freq: Every day | ORAL | 3 refills | Status: DC

## 2017-07-05 MED ORDER — LOSARTAN POTASSIUM 100 MG PO TABS: 100.0000 mg | ORAL_TABLET | Freq: Every day | ORAL | 3 refills | Status: DC

## 2017-07-05 MED ORDER — OMEPRAZOLE 40 MG PO CPDR
40.0000 mg | DELAYED_RELEASE_CAPSULE | Freq: Every day | ORAL | 3 refills | Status: DC
Start: 2017-07-05 — End: 2018-05-12

## 2017-07-05 MED ORDER — HYDROCHLOROTHIAZIDE 25 MG PO TABS: 25.0000 mg | ORAL_TABLET | Freq: Every day | ORAL | 3 refills | Status: DC

## 2017-07-05 NOTE — Patient Instructions (Addendum)
Thank you for coming to the office today.  Refilled BP medications  Elevated TSH 13 - likely due to out of med for 1 week - Free T4 is normal. Refilled Levothyroxine same dose 125 to [facility] orders in 2-3 months (prefer fasting)  For Pap Smear - next due within 1 to 2 years - would add HPV co testing - good for 3-5 years  For Mammogram screening for breast cancer  - if interested can let us know to order this   [facility] [place], [place] [address] Phone: [phone]  DUE for [place] (no food or drink after midnight before the lab appointment, only water or coffee without cream/sugar on the morning of)  SCHEDULE "Lab Only" visit in the morning at the clinic for lab draw in 1 YEAR  - Make sure Lab Only appointment is at about 1 week before your next appointment, so that results will be available  For Lab Results, once available within 2-3 days of blood draw, you can can log in to MyChart online to view your results and a brief explanation. Also, we can discuss results at next follow-up visit.   Please schedule a Follow-up Appointment to: Return in about 1 year (around 07/06/2018) for Annual Physical.  If you have any other questions or concerns, please feel free to call the office or send a message through [place]. You may also schedule an earlier appointment if necessary.  Additionally, you may be receiving a survey about your experience at our office within a few days to 1 week by e-mail or mail. We value your feedback.  Nobie Putnam, DO [place]

## 2017-07-05 NOTE — Progress Notes (Signed)
Subjective:    Patient ID: Ashley Clarke, female    DOB: [birth year], 42 y.o.   MRN: [id number]  Ashley Clarke is a 42 y.o. female presenting on 07/05/2017 for Annual Exam and Hypothyroidism   HPI   Here Annual Physical and Lab Review  Hypothyroidism / Multinodular Goiter - Reports prior history of hypothyroidism, and in past prior PCP identified thyroid nodule on exam palpation, and was sent for [place] evaluation, ultimately has been managed with multinodular goiter over past few years by PCP and ENT Dr Tami Ribas. Has had several neck US and up to 3-4 thyroid nodule biopsies, negative. She has upcoming thyroid ultrasound - Today she reports has been doing well, no new concerns clinically - She was confused by elevated TSH >13 on last lab for physical, however she admits she ran out of levothyroxine for 1 week about 2 weeks ago and has restarted this for past week - She had normal Free T4 - Continues to take Levothyroxine 161mcg daily  CHRONIC HTN: Home BP improved overall. Current Meds - Losartan 100mg  daily, HCTZ 25mg  daily Reports good compliance, took meds today. Tolerating well, w/o complaints. Resolved edema on thiazide  Morbid Obesity BMI >42 / Hyperlipidemia - Reports overall weight stable, trying to improve, has goals Better hydrated. Limited regular exercise Elevated LDL Not on cholesterol med  GERD Reports chronic problem. Controlled on PPI Omeprazole 40mg  daily  Health Maintenance:  Cervical Cancer Screening: Reported s/p Supracervical hysterectomy (partial) done in 2014, she has had pap smear since, done in 2016 about 3 years ago, result was negative. Will be due for next pap smear 1-2 years with hpv co testing. Prior surgery GYN done by [facility], no longer following w/ them  No known fam history of colon cancer, not due for early screening.  Depression screen [facility] 2/9 07/05/2017 10/30/2016 08/29/2016  Decreased Interest 0 0 0  Down, Depressed, Hopeless 0  0 0  PHQ - 2 Score 0 0 0    Family History  Problem Relation Age of Onset  . Stroke Paternal Aunt   . Breast cancer Paternal Aunt 40  . Stroke Maternal Grandmother   . Colon cancer Neg Hx    Past Medical History:  Diagnosis Date  . Hyperlipidemia   . Hypertension   . Hypothyroidism   . PONV (postoperative nausea and vomiting)    Past Surgical History:  Procedure Laterality Date  . APPENDECTOMY    . DG GALL BLADDER    . KNEE SURGERY    . SUPRACERVICAL ABDOMINAL HYSTERECTOMY  2014   reported by patient, do not have op report  . TONSILLECTOMY      Social History   Tobacco Use  . Smoking status: Current Every Day Smoker    Packs/day: 0.25    Years: 30.00    Pack years: 7.50    Types: Cigarettes  . Smokeless tobacco: Current User  . Tobacco comment: < 0.5ppd avg smoking  Substance Use Topics  . Alcohol use: No    Alcohol/week: 0.0 oz  . Drug use: No    Review of Systems  Constitutional: Negative for activity change, appetite change, chills, diaphoresis, fatigue and fever.  HENT: Negative for congestion and hearing loss.   Eyes: Negative for visual disturbance.  Respiratory: Negative for apnea, cough, choking, chest tightness, shortness of breath and wheezing.   Cardiovascular: Negative for chest pain, palpitations and leg swelling.  Gastrointestinal: Negative for abdominal pain, anal bleeding, blood in stool, constipation,  diarrhea, nausea and vomiting.  Endocrine: Negative for cold intolerance.  Genitourinary: Negative for difficulty urinating, dysuria, frequency and hematuria.  Musculoskeletal: Negative for arthralgias, back pain and neck pain.  Skin: Negative for rash.  Allergic/Immunologic: Negative for environmental allergies.  Neurological: Negative for dizziness, weakness, light-headedness, numbness and headaches.  Hematological: Negative for adenopathy.  Psychiatric/Behavioral: Negative for behavioral problems, dysphoric mood and sleep disturbance. The  patient is not nervous/anxious.    Per HPI unless specifically indicated above     Objective:    BP 126/77   Pulse 83   Temp 98.7 F (37.1 C) (Oral)   Resp 16   Ht 5\' 8"  (1.727 m)   Wt 279 lb (126.6 kg)   SpO2 100%   BMI 42.42 kg/m   Wt Readings from Last 3 Encounters:  07/05/17 279 lb (126.6 kg)  10/30/16 280 lb (127 kg)  08/29/16 280 lb (127 kg)    Physical Exam  Constitutional: She is oriented to person, place, and time. She appears well-developed and well-nourished. No distress.  Well-appearing, comfortable, cooperative, large body frame, obese  HENT:  Head: Normocephalic and atraumatic.  Mouth/Throat: Oropharynx is clear and moist.  Frontal / maxillary sinuses non-tender. Nares patent without purulence or edema. Bilateral TMs clear without erythema, effusion or bulging. Oropharynx clear without erythema, exudates, edema or asymmetry.  Eyes: Pupils are equal, round, and reactive to light. Conjunctivae and EOM are normal. Right eye exhibits no discharge. Left eye exhibits no discharge.  Neck: Normal range of motion. Neck supple. No thyromegaly present.  Cardiovascular: Normal rate, regular rhythm, normal heart sounds and intact distal pulses.  No murmur heard. Pulmonary/Chest: Effort normal and breath sounds normal. No respiratory distress. She has no wheezes. She has no rales.  Abdominal: Soft. Bowel sounds are normal. She exhibits no distension and no mass. There is no tenderness.  Musculoskeletal: Normal range of motion. She exhibits no edema or tenderness.  Upper / Lower Extremities: - Normal muscle tone, strength bilateral upper extremities 5/5, lower extremities 5/5  Lymphadenopathy:    She has no cervical adenopathy.  Neurological: She is alert and oriented to person, place, and time.  Distal sensation intact to light touch all extremities  Skin: Skin is warm and dry. No rash noted. She is not diaphoretic. No erythema.  Psychiatric: She has a normal mood and  affect. Her behavior is normal.  Well groomed, good eye contact, normal speech and thoughts  Nursing note and vitals reviewed.  Results for orders placed or performed in visit on 06/25/17  CBC with Differential/Platelet  Result Value Ref Range   WBC 12.1 (H) 3.4 - 10.8 x10E3/uL   RBC 4.72 3.77 - 5.28 x10E6/uL   Hemoglobin 14.6 11.1 - 15.9 g/dL   Hematocrit 42.6 34.0 - 46.6 %   MCV 90 79 - 97 fL   MCH 30.9 26.6 - 33.0 pg   MCHC 34.3 31.5 - 35.7 g/dL   RDW 14.2 12.3 - 15.4 %   Platelets 303 150 - 450 x10E3/uL   Neutrophils 62 Not Estab. %   Lymphs 30 Not Estab. %   Monocytes 6 Not Estab. %   Eos 2 Not Estab. %   Basos 0 Not Estab. %   Neutrophils Absolute 7.4 (H) 1.4 - 7.0 x10E3/uL   Lymphocytes Absolute 3.6 (H) 0.7 - 3.1 x10E3/uL   Monocytes Absolute 0.7 0.1 - 0.9 x10E3/uL   EOS (ABSOLUTE) 0.3 0.0 - 0.4 x10E3/uL   Basophils Absolute 0.0 0.0 - 0.2 x10E3/uL  Immature Granulocytes 0 Not Estab. %   Immature Grans (Abs) 0.0 0.0 - 0.1 x10E3/uL  Hemoglobin A1c  Result Value Ref Range   Hgb A1c MFr Bld 5.5 4.8 - 5.6 %   Est. average glucose Bld gHb Est-mCnc 111 mg/dL  Lipid panel  Result Value Ref Range   Cholesterol, Total 189 100 - 199 mg/dL   Triglycerides 97 0 - 149 mg/dL   HDL 37 (L) >39 mg/dL   VLDL Cholesterol Cal 19 5 - 40 mg/dL   LDL Calculated 133 (H) 0 - 99 mg/dL   Chol/HDL Ratio 5.1 (H) 0.0 - 4.4 ratio  Comprehensive metabolic panel  Result Value Ref Range   Glucose 95 65 - 99 mg/dL   BUN 17 6 - 24 mg/dL   Creatinine, Ser 0.87 0.57 - 1.00 mg/dL   GFR calc non Af Amer 82 >59 mL/min/1.73   GFR calc Af Amer 94 >59 mL/min/1.73   BUN/Creatinine Ratio 20 9 - 23   Sodium 138 134 - 144 mmol/L   Potassium 3.5 3.5 - 5.2 mmol/L   Chloride 100 96 - 106 mmol/L   CO2 24 20 - 29 mmol/L   Calcium 9.4 8.7 - 10.2 mg/dL   Total Protein 7.8 6.0 - 8.5 g/dL   Albumin 4.6 3.5 - 5.5 g/dL   Globulin, Total 3.2 1.5 - 4.5 g/dL   Albumin/Globulin Ratio 1.4 1.2 - 2.2   Bilirubin Total  1.1 0.0 - 1.2 mg/dL   Alkaline Phosphatase 77 39 - 117 IU/L   AST 19 0 - 40 IU/L   ALT 25 0 - 32 IU/L  T4, free  Result Value Ref Range   Free T4 1.06 0.82 - 1.77 ng/dL  TSH  Result Value Ref Range   TSH 13.280 (H) 0.450 - 4.500 uIU/mL      Assessment & Plan:   Problem List Items Addressed This Visit    Adult hypothyroidism    Elevated TSH >13, but normal Free T4 - suspected secondary to out of levothyroxine for 1 week (2 weeks ago) now back on However remains clinically stable and controlled Complicated by multinodular goiter, followed by ENT Tami Ribas for neck US and biopsies, negative in past, reported 3 nodules on R and 2 on L  Plan: 1. Continue Levothyroxine 167mcg daily for now - refill sent 2. Ordered printed [facility] TSH Free T4 orders given to patient for 2-3 months re-check  Follow-up then again 6-12 months likely repeat TSH      Relevant Medications   levothyroxine (SYNTHROID, LEVOTHROID) 125 MCG tablet   Other Relevant Orders   TSH   T4, free   Essential (primary) hypertension    Well-controlled HTN - Home BP readings improved No known complications   Plan:  1. Continue current BP regimen - Losartan 100mg  daily, HCTZ 25mg  daily (also for edema) 2. Encourage improved lifestyle - low sodium diet, keep improve regular exercise 3. Continue to monitor BP outside office, bring readings to next visit, if persistently >140/90 or new symptoms notify office sooner      Relevant Medications   hydrochlorothiazide (HYDRODIURIL) 25 MG tablet   losartan (COZAAR) 100 MG tablet   GERD (gastroesophageal reflux disease)    Stable, controlled on PPI Refill Omeprazole      Relevant Medications   omeprazole (PRILOSEC) 40 MG capsule   Hyperlipidemia    Suboptimal control lipids, improve lifestyle Last lipid panel 06/2017  Plan: Encourage improved lifestyle - low carb/cholesterol, reduce portion size, continue improving regular  exercise      Relevant Medications    hydrochlorothiazide (HYDRODIURIL) 25 MG tablet   losartan (COZAAR) 100 MG tablet   Morbid obesity with BMI of 40.0-44.9, adult (HCC)    Weight stable, concern BMI >42 With risk factors Encourage improve diet and exercise goal weight loss      Multinodular goiter    History of multinodular goiter, followed by ENT Tami Ribas for neck US and biopsies, negative in past, reported 3 nodules on R and 2 on L Last lab elevated TSH, likely missed 1 week as factor Continue current dose Levothyroxine 135mcg daily Repeat [facility] TSH/Free T4 in 2-3 months F/u [place] [facility] thyroid US      Relevant Medications   levothyroxine (SYNTHROID, LEVOTHROID) 125 MCG tablet   Other Relevant Orders   TSH   T4, free   Tobacco abuse    Active smoker < 0.25ppd with significant improvement down from 0.5, not ready to quit today Never on NRT or medicines Goal to self wean smoking if able, when mentally ready. Again reviewed Quitline  Discussion today >5 minutes (<10 minutes) specifically on counseling on risks of tobacco use, complications, treatment, smoking cessation.       Other Visit Diagnoses    Annual physical exam    -  Primary      Updated Health Maintenance information Reviewed recent lab results with patient Encouraged improvement to lifestyle with diet and exercise - Goal of weight loss   Meds ordered this encounter  Medications  . levothyroxine (SYNTHROID, LEVOTHROID) 125 MCG tablet    Sig: Take 1 tablet (125 mcg total) by mouth daily before breakfast.    Dispense:  90 tablet    Refill:  3  . omeprazole (PRILOSEC) 40 MG capsule    Sig: Take 1 capsule (40 mg total) by mouth daily.    Dispense:  90 capsule    Refill:  3  . hydrochlorothiazide (HYDRODIURIL) 25 MG tablet    Sig: Take 1 tablet (25 mg total) by mouth daily.    Dispense:  90 tablet    Refill:  3  . losartan (COZAAR) 100 MG tablet    Sig: Take 1 tablet (100 mg total) by mouth daily.    Dispense:  90 tablet    Refill:  3       Follow up plan: Return in about 1 year (around 07/06/2018) for Annual Physical.  Future labs ordered for [facility] TSH/Free T4 thyroid within 2-3 months given to patient to submit. Will notify in future before apt in 1 year for future [facility] lorders.  Nobie Putnam, [facility] 07/06/2017, 12:16 AM

## 2017-07-06 ENCOUNTER — Encounter: Payer: Self-pay | Admitting: Family Medicine

## 2017-07-06 NOTE — Assessment & Plan Note (Signed)
Active smoker < 0.25ppd with significant improvement down from 0.5, not ready to quit today Never on NRT or medicines Goal to self wean smoking if able, when mentally ready. Again reviewed Quitline  Discussion today >5 minutes (<10 minutes) specifically on counseling on risks of tobacco use, complications, treatment, smoking cessation.

## 2017-07-06 NOTE — Assessment & Plan Note (Signed)
Weight stable, concern BMI >42 With risk factors Encourage improve diet and exercise goal weight loss

## 2017-07-06 NOTE — Assessment & Plan Note (Signed)
History of multinodular goiter, followed by ENT Tami Ribas for neck US and biopsies, negative in past, reported 3 nodules on R and 2 on L Last lab elevated TSH, likely missed 1 week as factor Continue current dose Levothyroxine 147mcg daily Repeat [facility] TSH/Free T4 in 2-3 months F/u [place] [facility] thyroid US

## 2017-07-06 NOTE — Assessment & Plan Note (Signed)
Stable, controlled on PPI Refill Omeprazole

## 2017-07-06 NOTE — Assessment & Plan Note (Signed)
Suboptimal control lipids, improve lifestyle Last lipid panel 06/2017  Plan: Encourage improved lifestyle - low carb/cholesterol, reduce portion size, continue improving regular exercise

## 2017-07-06 NOTE — Assessment & Plan Note (Signed)
Well-controlled HTN - Home BP readings improved No known complications   Plan:  1. Continue current BP regimen - Losartan 100mg daily, HCTZ 25mg daily (also for edema) 2. Encourage improved lifestyle - low sodium diet, keep improve regular exercise 3. Continue to monitor BP outside office, bring readings to next visit, if persistently >140/90 or new symptoms notify office sooner 

## 2017-07-06 NOTE — Assessment & Plan Note (Addendum)
Elevated TSH >13, but normal Free T4 - suspected secondary to out of levothyroxine for 1 week (2 weeks ago) now back on However remains clinically stable and controlled Complicated by multinodular goiter, followed by ENT Tami Ribas for neck US and biopsies, negative in past, reported 3 nodules on R and 2 on L  Plan: 1. Continue Levothyroxine 148mcg daily for now - refill sent 2. Ordered printed [facility] TSH Free T4 orders given to patient for 2-3 months re-check  Follow-up then again 6-12 months likely repeat TSH

## 2017-08-23 ENCOUNTER — Ambulatory Visit: Admission: RE | Admit: 2017-08-23 | Payer: Managed Care, Other (non HMO) | Source: Ambulatory Visit

## 2017-09-17 ENCOUNTER — Ambulatory Visit
Admission: RE | Admit: 2017-09-17 | Discharge: 2017-09-17 | Disposition: A | Discharge status: Home / Self Care | Payer: Managed Care, Other (non HMO) | Source: Ambulatory Visit | Attending: Unknown Physician Specialty | Admitting: Unknown Physician Specialty

## 2017-09-17 DIAGNOSIS — E041 Nontoxic single thyroid nodule: Secondary | ICD-10-CM | POA: Insufficient documentation

## 2017-10-21 ENCOUNTER — Other Ambulatory Visit: Payer: Self-pay | Admitting: Family Medicine

## 2017-10-21 DIAGNOSIS — E039 Hypothyroidism, unspecified: Secondary | ICD-10-CM

## 2017-10-30 ENCOUNTER — Other Ambulatory Visit: Payer: Self-pay

## 2017-10-30 DIAGNOSIS — E039 Hypothyroidism, unspecified: Secondary | ICD-10-CM

## 2017-11-01 ENCOUNTER — Encounter: Payer: Self-pay | Admitting: Family Medicine

## 2017-11-01 ENCOUNTER — Other Ambulatory Visit: Payer: Self-pay | Admitting: Family Medicine

## 2017-11-01 ENCOUNTER — Ambulatory Visit: Payer: Managed Care, Other (non HMO) | Admitting: Family Medicine

## 2017-11-01 VITALS — BP 125/70 | HR 74 | Temp 98.6°F | Resp 16 | Ht 68.0 in | Wt 276.0 lb

## 2017-11-01 DIAGNOSIS — Z0000 Encounter for general adult medical examination without abnormal findings: Secondary | ICD-10-CM

## 2017-11-01 DIAGNOSIS — E039 Hypothyroidism, unspecified: Secondary | ICD-10-CM | POA: Diagnosis not present

## 2017-11-01 DIAGNOSIS — Z720 Tobacco use: Secondary | ICD-10-CM | POA: Diagnosis not present

## 2017-11-01 DIAGNOSIS — Z6841 Body Mass Index (BMI) 40.0 and over, adult: Secondary | ICD-10-CM

## 2017-11-01 DIAGNOSIS — E782 Mixed hyperlipidemia: Secondary | ICD-10-CM

## 2017-11-01 DIAGNOSIS — K219 Gastro-esophageal reflux disease without esophagitis: Secondary | ICD-10-CM

## 2017-11-01 DIAGNOSIS — I10 Essential (primary) hypertension: Secondary | ICD-10-CM

## 2017-11-01 DIAGNOSIS — R7309 Other abnormal glucose: Secondary | ICD-10-CM

## 2017-11-01 LAB — TSH: TSH: 6.68 u[IU]/mL — ABNORMAL HIGH (ref 0.450–4.500)

## 2017-11-01 LAB — T4, FREE: FREE T4: 1.19 ng/dL (ref 0.82–1.77)

## 2017-11-01 NOTE — Progress Notes (Signed)
Subjective:    Patient ID: Ashley Clarke, female    DOB: 1975/06/20, 42 y.o.   MRN: [id number]  Ashley Clarke is a 42 y.o. female presenting on 11/01/2017 for Hypothyroidism   HPI   FOLLOW-UP Hypothyroidism / Multinodular Goiter - Reports prior history of hypothyroidism and goiter, see prior note for background, prior evaluation by ENT Dr Tami Ribas, prior neck US and biopsy. - Interval update with prior TSH >13 after off levothyroxine for up to 2 weeks, continued on levo 125 - Today now recent labs show improved TSH >6 and normal Free T4 on current dose still - No new clinical concerns. Feels good. Thinks thyroid dose is working - Continues to take Levothyroxine 161mcg daily  Morbid Obesity BMI >41 - Reports overall weight stable, trying to improve, has goals to continue wt loss with improve exercise regimen and go to gym, also diet changes low carb, reduce portions. Limit snacks.   Tobacco Abuse Still active smoker, she is down to weaning cigarettes down to 5-6 per day, she still has problem with stressful parts of day morning. Has been successful in reducing, and wants to decline NRT or medication at this time.  Health Maintenance: Due for Flu Shot, declines today despite counseling on benefits   Depression screen [facility] 2/9 11/01/2017 07/05/2017 10/30/2016  Decreased Interest 0 0 0  Down, Depressed, Hopeless 0 0 0  PHQ - 2 Score 0 0 0    Social History   Tobacco Use  . Smoking status: Current Every Day Smoker    Packs/day: 0.25    Years: 30.00    Pack years: 7.50    Types: Cigarettes  . Smokeless tobacco: Current User  . Tobacco comment: < 0.5ppd avg smoking  Substance Use Topics  . Alcohol use: No    Alcohol/week: 0.0 standard drinks  . Drug use: No    Review of Systems Per HPI unless specifically indicated above     Objective:    BP 125/70   Pulse 74   Temp 98.6 F (37 C) (Oral)   Resp 16   Ht 5\' 8"  (1.727 m)   Wt 276 lb (125.2 kg)   BMI 41.97  kg/m   Wt Readings from Last 3 Encounters:  11/01/17 276 lb (125.2 kg)  07/05/17 279 lb (126.6 kg)  10/30/16 280 lb (127 kg)    Physical Exam  Constitutional: She is oriented to person, place, and time. She appears well-developed and well-nourished. No distress.  Well-appearing, comfortable, cooperative, obese  HENT:  Head: Normocephalic and atraumatic.  Mouth/Throat: Oropharynx is clear and moist.  Eyes: Conjunctivae are normal. Right eye exhibits no discharge. Left eye exhibits no discharge.  Cardiovascular: Normal rate.  Pulmonary/Chest: Effort normal.  Musculoskeletal: She exhibits no edema.  Neurological: She is alert and oriented to person, place, and time.  Skin: Skin is warm and dry. No rash noted. She is not diaphoretic. No erythema.  Psychiatric: She has a normal mood and affect. Her behavior is normal.  Well groomed, good eye contact, normal speech and thoughts  Nursing note and vitals reviewed.  Results for orders placed or performed in visit on 10/30/17  TSH  Result Value Ref Range   TSH 6.680 (H) 0.450 - 4.500 uIU/mL  T4, free  Result Value Ref Range   Free T4 1.19 0.82 - 1.77 ng/dL      Assessment & Plan:   Problem List Items Addressed This Visit    Adult hypothyroidism    Significantly  improved TSH from >13 to >6 Normal Free T4 Remains clinically stable and controlled Complicated by multinodular goiter, followed by ENT Tami Ribas for neck US and biopsies, negative in past, reported 3 nodules on R and 2 on L  Plan: 1. Continue Levothyroxine 163mcg daily for now 2. Future will check labs again [facility] in 9 months at next yearly 06/2018 - may return sooner if request [facility] TSH Free T4      Morbid obesity with BMI of 40.0-44.9, adult ([place]) - Primary Wt down with improving lifestyle Failed BMI metric, now changes with prescribed exercise regimen, join gym, reduce carb diet    Tobacco abuse Active smoker, plan to taper down gradually, self wean to  quit Offered NRT Given [place] QUITLINE contact for counseling Future follow-up if need consider meds       No orders of the defined types were placed in this encounter.   Follow up plan: Return in about 9 months (around 08/02/2018) for Annual Physical + Pap.  Future labs ordered for 06/2018  Biometric form completed Tobacco / BMI  Nobie Putnam, DO [facility] 11/01/2017, 10:33 PM

## 2017-11-01 NOTE — Patient Instructions (Addendum)
Thank you for coming to the office today.  Keep up the good work with reducing smoking  And quitting, let me know if need other medicines or help.  [id number] [phone]  ---- Keep up good work with plan to join gym and exercise / diet regimen  Thyroid results are much improved, TSH 6.6, Free T4 was normal. No change - continue Levothyroxine 14mcg  If you want [place] to check your thyroid BEFORE June 2020 - then notify me and we can order it.  DUE for FASTING BLOOD WORK (no food or drink after midnight before the lab appointment, only water or coffee without cream/sugar on the morning of)  [facility]  9 months, - about 1 week before physical - let us know and we can release orders  - Make sure Lab Only appointment is at about 1 week before your next appointment, so that results will be available  For Lab Results, once available within 2-3 days of blood draw, you can can log in to MyChart online to view your results and a brief explanation. Also, we can discuss results at next follow-up visit.   Please schedule a Follow-up Appointment to: Return in about 9 months (around 08/02/2018) for Annual Physical + Pap.  If you have any other questions or concerns, please feel free to call the office or send a message through [place]. You may also schedule an earlier appointment if necessary.  Additionally, you may be receiving a survey about your experience at our office within a few days to 1 week by e-mail or mail. We value your feedback.  Nobie Putnam, DO [place]

## 2017-11-01 NOTE — Assessment & Plan Note (Signed)
Significantly improved TSH from >13 to >6 Normal Free T4 Remains clinically stable and controlled Complicated by multinodular goiter, followed by ENT Tami Ribas for neck US and biopsies, negative in past, reported 3 nodules on R and 2 on L  Plan: 1. Continue Levothyroxine 146mcg daily for now 2. Future will check labs again [facility] in 9 months at next yearly 06/2018 - may return sooner if request [facility] TSH Free T4

## 2018-05-11 ENCOUNTER — Other Ambulatory Visit: Payer: Self-pay | Admitting: Family Medicine

## 2018-05-11 DIAGNOSIS — K219 Gastro-esophageal reflux disease without esophagitis: Secondary | ICD-10-CM

## 2018-05-24 ENCOUNTER — Other Ambulatory Visit: Payer: Self-pay | Admitting: Family Medicine

## 2018-05-24 DIAGNOSIS — E039 Hypothyroidism, unspecified: Secondary | ICD-10-CM

## 2018-06-27 ENCOUNTER — Emergency Department: Payer: Managed Care, Other (non HMO)

## 2018-06-27 ENCOUNTER — Encounter: Payer: Self-pay | Admitting: Emergency Medicine

## 2018-06-27 ENCOUNTER — Other Ambulatory Visit: Payer: Self-pay

## 2018-06-27 ENCOUNTER — Emergency Department
Admission: EM | Admit: 2018-06-27 | Discharge: 2018-06-27 | Disposition: A | Discharge status: Home / Self Care | Payer: Managed Care, Other (non HMO) | Attending: Emergency Medicine | Admitting: Emergency Medicine

## 2018-06-27 DIAGNOSIS — Z79899 Other long term (current) drug therapy: Secondary | ICD-10-CM | POA: Insufficient documentation

## 2018-06-27 DIAGNOSIS — R109 Unspecified abdominal pain: Secondary | ICD-10-CM | POA: Diagnosis present

## 2018-06-27 DIAGNOSIS — F17210 Nicotine dependence, cigarettes, uncomplicated: Secondary | ICD-10-CM | POA: Diagnosis not present

## 2018-06-27 DIAGNOSIS — E039 Hypothyroidism, unspecified: Secondary | ICD-10-CM | POA: Diagnosis not present

## 2018-06-27 DIAGNOSIS — F17228 Nicotine dependence, chewing tobacco, with other nicotine-induced disorders: Secondary | ICD-10-CM | POA: Diagnosis not present

## 2018-06-27 DIAGNOSIS — Y998 Other external cause status: Secondary | ICD-10-CM | POA: Insufficient documentation

## 2018-06-27 DIAGNOSIS — I10 Essential (primary) hypertension: Secondary | ICD-10-CM | POA: Diagnosis not present

## 2018-06-27 DIAGNOSIS — Y92410 Unspecified street and highway as the place of occurrence of the external cause: Secondary | ICD-10-CM | POA: Insufficient documentation

## 2018-06-27 DIAGNOSIS — Y9389 Activity, other specified: Secondary | ICD-10-CM | POA: Diagnosis not present

## 2018-06-27 LAB — COMPREHENSIVE METABOLIC PANEL
ALT: 38 U/L (ref 0–44)
AST: 37 U/L (ref 15–41)
Albumin: 4.2 g/dL (ref 3.5–5.0)
Alkaline Phosphatase: 69 U/L (ref 38–126)
Anion gap: 12 (ref 5–15)
BUN: 10 mg/dL (ref 6–20)
CO2: 24 mmol/L (ref 22–32)
Calcium: 8.9 mg/dL (ref 8.9–10.3)
Chloride: 101 mmol/L (ref 98–111)
Creatinine, Ser: 0.68 mg/dL (ref 0.44–1.00)
GFR calc Af Amer: >60 mL/min (ref >60)
GFR calc non Af Amer: >60 mL/min (ref >60)
Glucose, Bld: 113 mg/dL — ABNORMAL HIGH (ref 70–99)
Potassium: 3.6 mmol/L (ref 3.5–5.1)
Sodium: 137 mmol/L (ref 135–145)
Total Bilirubin: 1.8 mg/dL — ABNORMAL HIGH (ref 0.3–1.2)
Total Protein: 7.9 g/dL (ref 6.5–8.1)

## 2018-06-27 LAB — CBC WITH DIFFERENTIAL/PLATELET
Abs Immature Granulocytes: 0.03 10*3/uL (ref 0.00–0.07)
Basophils Absolute: 0 10*3/uL (ref 0.0–0.1)
Basophils Relative: 0 %
Eosinophils Absolute: 0.3 10*3/uL (ref 0.0–0.5)
Eosinophils Relative: 3 %
HCT: 41.3 % (ref 36.0–46.0)
Hemoglobin: 13.9 g/dL (ref 12.0–15.0)
Immature Granulocytes: 0 %
Lymphocytes Relative: 33 %
Lymphs Abs: 3.5 10*3/uL (ref 0.7–4.0)
MCH: 29.7 pg (ref 26.0–34.0)
MCHC: 33.7 g/dL (ref 30.0–36.0)
MCV: 88.2 fL (ref 80.0–100.0)
Monocytes Absolute: 0.5 10*3/uL (ref 0.1–1.0)
Monocytes Relative: 5 %
Neutro Abs: 6.3 10*3/uL (ref 1.7–7.7)
Neutrophils Relative %: 59 %
Platelets: 273 10*3/uL (ref 150–400)
RBC: 4.68 MIL/uL (ref 3.87–5.11)
RDW: 13.7 % (ref 11.5–15.5)
WBC: 10.7 10*3/uL — ABNORMAL HIGH (ref 4.0–10.5)
nRBC: 0 % (ref 0.0–0.2)

## 2018-06-27 LAB — LIPASE, BLOOD: Lipase: 24 U/L (ref 11–51)

## 2018-06-27 MED ORDER — IOHEXOL 300 MG/ML  SOLN
100.0000 mL | Freq: Once | INTRAMUSCULAR | Status: AC | PRN
  Administered 2018-06-27: 100 mL via INTRAVENOUS
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
## 2018-07-07 ENCOUNTER — Other Ambulatory Visit: Payer: Self-pay | Admitting: Family Medicine

## 2018-07-07 DIAGNOSIS — I10 Essential (primary) hypertension: Secondary | ICD-10-CM

## 2018-07-10 ENCOUNTER — Other Ambulatory Visit: Payer: Self-pay

## 2018-07-10 ENCOUNTER — Encounter: Payer: Self-pay | Admitting: Family Medicine

## 2018-07-10 ENCOUNTER — Ambulatory Visit (INDEPENDENT_AMBULATORY_CARE_PROVIDER_SITE_OTHER): Payer: Managed Care, Other (non HMO) | Admitting: Family Medicine

## 2018-07-10 VITALS — BP 109/70 | HR 73 | Temp 98.5°F | Ht 68.0 in | Wt 281.2 lb

## 2018-07-10 DIAGNOSIS — M545 Low back pain, unspecified: Secondary | ICD-10-CM

## 2018-07-10 DIAGNOSIS — M25512 Pain in left shoulder: Secondary | ICD-10-CM

## 2018-07-10 DIAGNOSIS — S46012A Strain of muscle(s) and tendon(s) of the rotator cuff of left shoulder, initial encounter: Secondary | ICD-10-CM | POA: Diagnosis not present

## 2018-07-10 DIAGNOSIS — M542 Cervicalgia: Secondary | ICD-10-CM

## 2018-07-10 DIAGNOSIS — M62830 Muscle spasm of back: Secondary | ICD-10-CM

## 2018-07-10 DIAGNOSIS — S134XXA Sprain of ligaments of cervical spine, initial encounter: Secondary | ICD-10-CM

## 2018-07-10 MED ORDER — BACLOFEN 10 MG PO TABS: 5.0000 mg | ORAL_TABLET | Freq: Three times a day (TID) | ORAL | 1 refills | Status: DC | PRN

## 2018-07-10 NOTE — Patient Instructions (Addendum)
Thank you for coming to the office today.  Most likely whiplash injury to muscles and strain of L rotator cuff, will take time to heal, likely few weeks maybe longer  Start taking Baclofen (Lioresal) 10mg  (muscle relaxant) - start with half (cut) to one whole pill at night as needed for next 1-3 nights (may make you drowsy, caution with driving) see how it affects you, then if tolerated increase to one pill 2 to 3 times a day or (every 8 hours as needed)  Recommend trial of Anti-inflammatory with Naproxen (Naprosyn) 500mg  tabs - take one with food and plenty of water TWICE daily every day (breakfast and dinner), for next 2 to 4 weeks, then you may take only as needed - DO NOT TAKE any ibuprofen, aleve, motrin while you are taking this medicine - It is safe to take Tylenol Ext Str 500mg  tabs - take 1 to 2 (max dose 1000mg ) every 6 hours as needed for breakthrough pain, max 24 hour daily dose is 6 to 8 tablets or 4000mg   If need can refer to Physical Therapy  Try muscle rub, heating pad, range of motion exercises  Please schedule a Follow-up Appointment to: Return in about 4 weeks (around 08/07/2018), or if symptoms worsen or fail to improve, for neck whiplash, strain, shoulder strain.  If you have any other questions or concerns, please feel free to call the office or send a message through [place]. You may also schedule an earlier appointment if necessary.  Additionally, you may be receiving a survey about your experience at our office within a few days to 1 week by e-mail or mail. We value your feedback.  Nobie Putnam, DO [facility], [facility]  Range of Motion Shoulder Exercises  [place] with your good arm against a counter or table for support [facility] forward with a wide stance (make sure your body is comfortable) - Your painful shoulder should hang down and feel "heavy" - Gently move your painful arm in small circles "clockwise" for several turns -  Switch to "counterclockwise" for several turns - Early on keep circles narrow and move slowly - Later in rehab, move in larger circles and faster movement   Wall Crawl - Stand close (about 1-2 ft away) to a wall, facing it directly - Reach out with your arm of painful shoulder and place fingers (not palm) on wall - You should make contact with wall at your waist level - Slowly walk your fingers up the wall. Stay in contact with wall entire time, do not remove fingers - Keep walking fingers up wall until you reach shoulder level - You may feel tightening or mild discomfort, once you reach a height that causes pain or if you are already above your shoulder height then stop. Repeat from starting position. - Early on stand closer to wall, move fingers slowly, and stay at or below shoulder level - Later in rehab, stand farther away from wall (fingertips), move fingers quicker, go above shoulder level     Shoulder Exercises Ask your health care provider which exercises are safe for you. Do exercises exactly as told by your health care provider and adjust them as directed. It is normal to feel mild stretching, pulling, tightness, or discomfort as you do these exercises, but you should stop right away if you feel sudden pain or your pain gets worse.Do not begin these exercises until told by your health care provider. Range of Motion Exercises  These exercises warm up your muscles and joints and improve the movement and flexibility of your shoulder. These exercises also help to relieve pain, numbness, and tingling. These exercises involve stretching your injured shoulder directly. Exercise A: Pendulum 1. Stand near a wall or a surface that you can hold onto for balance. 2. Bend at the waist and let your left / right arm hang straight down. Use your other arm to support you. Keep your back straight and do not lock your knees. 3. Relax your left / right arm and shoulder muscles, and move  your hips and your trunk so your left / right arm swings freely. Your arm should swing because of the motion of your body, not because you are using your arm or shoulder muscles. 4. Keep moving your body so your arm swings in the following directions, as told by your health care provider: ? Side to side. ? Forward and backward. ? In clockwise and counterclockwise circles. 5. Continue each motion for __________ seconds, or for as long as told by your health care provider. 6. Slowly return to the starting position. Repeat __________ times. Complete this exercise __________ times a day. Exercise B:Flexion, Standing 1. Stand and hold a broomstick, a cane, or a similar object. Place your hands a little more than shoulder-width apart on the object. Your left / right hand should be palm-up, and your other hand should be palm-down. 2. Keep your elbow straight and keep your shoulder muscles relaxed. Push the stick down with your healthy arm to raise your left / right arm in front of your body, and then over your head until you feel a stretch in your shoulder. ? Avoid shrugging your shoulder while you raise your arm. Keep your shoulder blade tucked down toward the middle of your back. 3. Hold for __________ seconds. 4. Slowly return to the starting position. Repeat __________ times. Complete this exercise __________ times a day. Exercise C: Abduction, Standing 1. Stand and hold a broomstick, a cane, or a similar object. Place your hands a little more than shoulder-width apart on the object. Your left / right hand should be palm-up, and your other hand should be palm-down. 2. While keeping your elbow straight and your shoulder muscles relaxed, push the stick across your body toward your left / right side. Raise your left / right arm to the side of your body and then over your head until you feel a stretch in your shoulder. ? Do not raise your arm above shoulder height, unless your health care provider tells  you to do that. ? Avoid shrugging your shoulder while you raise your arm. Keep your shoulder blade tucked down toward the middle of your back. 3. Hold for __________ seconds. 4. Slowly return to the starting position. Repeat __________ times. Complete this exercise __________ times a day. Exercise D:Internal Rotation 1. Place your left / right hand behind your back, palm-up. 2. Use your other hand to dangle an exercise band, a towel, or a similar object over your shoulder. Grasp the band with your left / right hand so you are holding onto both ends. 3. Gently pull up on the band until you feel a stretch in the front of your left / right shoulder. ? Avoid shrugging your shoulder while you raise your arm. Keep your shoulder blade tucked down toward the middle of your back. 4. Hold for __________ seconds. 5. Release the stretch by letting go of the band and lowering your hands. Repeat __________ times.  Complete this exercise __________ times a day. Stretching Exercises  These exercises warm up your muscles and joints and improve the movement and flexibility of your shoulder. These exercises also help to relieve pain, numbness, and tingling. These exercises are done using your healthy shoulder to help stretch the muscles of your injured shoulder. Exercise E: Warehouse manager (External Rotation and Abduction) 1. Stand in a doorway with one of your feet slightly in front of the other. This is called a staggered stance. If you cannot reach your forearms to the door frame, stand facing a corner of a room. 2. Choose one of the following positions as told by your health care provider: ? Place your hands and forearms on the door frame above your head. ? Place your hands and forearms on the door frame at the height of your head. ? Place your hands on the door frame at the height of your elbows. 3. Slowly move your weight onto your front foot until you feel a stretch across your chest and in the front of  your shoulders. Keep your head and chest upright and keep your abdominal muscles tight. 4. Hold for __________ seconds. 5. To release the stretch, shift your weight to your back foot. Repeat __________ times. Complete this stretch __________ times a day. Exercise F:Extension, Standing 1. Stand and hold a broomstick, a cane, or a similar object behind your back. ? Your hands should be a little wider than shoulder-width apart. ? Your palms should face away from your back. 2. Keeping your elbows straight and keeping your shoulder muscles relaxed, move the stick away from your body until you feel a stretch in your shoulder. ? Avoid shrugging your shoulders while you move the stick. Keep your shoulder blade tucked down toward the middle of your back. 3. Hold for __________ seconds. 4. Slowly return to the starting position. Repeat __________ times. Complete this exercise __________ times a day. Strengthening Exercises           These exercises build strength and endurance in your shoulder. Endurance is the ability to use your muscles for a long time, even after they get tired. Exercise G:External Rotation 1. Sit in a stable chair without armrests. 2. Secure an exercise band at elbow height on your left / right side. 3. Place a soft object, such as a folded towel or a small pillow, between your left / right upper arm and your body to move your elbow a few inches away (about 10 cm) from your side. 4. Hold the end of the band so it is tight and there is no slack. 5. Keeping your elbow pressed against the soft object, move your left / right forearm out, away from your abdomen. Keep your body steady so only your forearm moves. 6. Hold for __________ seconds. 7. Slowly return to the starting position. Repeat __________ times. Complete this exercise __________ times a day. Exercise H:Shoulder Abduction 1. Sit in a stable chair without armrests, or stand. 2. Hold a __________ weight in your  left / right hand, or hold an exercise band with both hands. 3. Start with your arms straight down and your left / right palm facing in, toward your body. 4. Slowly lift your left / right hand out to your side. Do not lift your hand above shoulder height unless your health care provider tells you that this is safe. ? Keep your arms straight. ? Avoid shrugging your shoulder while you do this movement. Keep your shoulder blade tucked  down toward the middle of your back. 5. Hold for __________ seconds. 6. Slowly lower your arm, and return to the starting position. Repeat __________ times. Complete this exercise __________ times a day. Exercise I:Shoulder Extension 1. Sit in a stable chair without armrests, or stand. 2. Secure an exercise band to a stable object in front of you where it is at shoulder height. 3. Hold one end of the exercise band in each hand. Your palms should face each other. 4. Straighten your elbows and lift your hands up to shoulder height. 5. Step back, away from the secured end of the exercise band, until the band is tight and there is no slack. 6. Squeeze your shoulder blades together as you pull your hands down to the sides of your thighs. Stop when your hands are straight down by your sides. Do not let your hands go behind your body. 7. Hold for __________ seconds. 8. Slowly return to the starting position. Repeat __________ times. Complete this exercise __________ times a day. Exercise J:Standing Shoulder Row 1. Sit in a stable chair without armrests, or stand. 2. Secure an exercise band to a stable object in front of you so it is at waist height. 3. Hold one end of the exercise band in each hand. Your palms should be in a thumbs-up position. 4. Bend each of your elbows to an "L" shape (about 90 degrees) and keep your upper arms at your sides. 5. Step back until the band is tight and there is no slack. 6. Slowly pull your elbows back behind you. 7. Hold for  __________ seconds. 8. Slowly return to the starting position. Repeat __________ times. Complete this exercise __________ times a day. Exercise K:Shoulder Press-Ups 1. Sit in a stable chair that has armrests. Sit upright, with your feet flat on the floor. 2. Put your hands on the armrests so your elbows are bent and your fingers are pointing forward. Your hands should be about even with the sides of your body. 3. Push down on the armrests and use your arms to lift yourself off of the chair. Straighten your elbows and lift yourself up as much as you comfortably can. ? Move your shoulder blades down, and avoid letting your shoulders move up toward your ears. ? Keep your feet on the ground. As you get stronger, your feet should support less of your body weight as you lift yourself up. 4. Hold for __________ seconds. 5. Slowly lower yourself back into the chair. Repeat __________ times. Complete this exercise __________ times a day. Exercise L: Wall Push-Ups 1. Stand so you are facing a stable wall. Your feet should be about one arm-length away from the wall. 2. Lean forward and place your palms on the wall at shoulder height. 3. Keep your feet flat on the floor as you bend your elbows and lean forward toward the wall. 4. Hold for __________ seconds. 5. Straighten your elbows to push yourself back to the starting position. Repeat __________ times. Complete this exercise __________ times a day. This information is not intended to replace advice given to you by your health care provider. Make sure you discuss any questions you have with your health care provider. Document Released: 11/15/2004 Document Revised: 05/07/2017 Document Reviewed: 09/12/2014 Elsevier Interactive Patient Education  2019 [employer]    Cervical Strain and Sprain Rehab Ask your health care provider which exercises are safe for you. Do exercises exactly as told by your health care provider and adjust them as directed.  It  is normal to feel mild stretching, pulling, tightness, or discomfort as you do these exercises, but you should stop right away if you feel sudden pain or your pain gets worse.Do not begin these exercises until told by your health care provider. Stretching and range of motion exercises These exercises warm up your muscles and joints and improve the movement and flexibility of your neck. These exercises also help to relieve pain, numbness, and tingling. Exercise A: Cervical side bend  1. Using good posture, sit on a stable chair or stand up. 2. Without moving your shoulders, slowly tilt your left / right ear to your shoulder until you feel a stretch in your neck muscles. You should be looking straight ahead. 3. Hold for __________ seconds. 4. Repeat with the other side of your neck. Repeat __________ times. Complete this exercise __________ times a day. Exercise B: Cervical rotation  1. Using good posture, sit on a stable chair or stand up. 2. Slowly turn your head to the side as if you are looking over your left / right shoulder. ? Keep your eyes level with the ground. ? Stop when you feel a stretch along the side and the back of your neck. 3. Hold for __________ seconds. 4. Repeat this by turning to your other side. Repeat __________ times. Complete this exercise __________ times a day. Exercise C: Thoracic extension and pectoral stretch 1. Roll a towel or a small blanket so it is about 4 inches (10 cm) in diameter. 2. Lie down on your back on a firm surface. 3. Put the towel lengthwise, under your spine in the middle of your back. It should not be not under your shoulder blades. The towel should line up with your spine from your middle back to your lower back. 4. Put your hands behind your head and let your elbows fall out to your sides. 5. Hold for __________ seconds. Repeat __________ times. Complete this exercise __________ times a day. Strengthening exercises These exercises build  strength and endurance in your neck. Endurance is the ability to use your muscles for a long time, even after your muscles get tired. Exercise D: Upper cervical flexion, isometric 1. Lie on your back with a thin pillow behind your head and a small rolled-up towel under your neck. 2. Gently tuck your chin toward your chest and nod your head down to look toward your feet. Do not lift your head off the pillow. 3. Hold for __________ seconds. 4. Release the tension slowly. Relax your neck muscles completely before you repeat this exercise. Repeat __________ times. Complete this exercise __________ times a day. Exercise E: Cervical extension, isometric  1. Stand about 6 inches (15 cm) away from a wall, with your back facing the wall. 2. Place a soft object, about 6-8 inches (15-20 cm) in diameter, between the back of your head and the wall. A soft object could be a small pillow, a ball, or a folded towel. 3. Gently tilt your head back and press into the soft object. Keep your jaw and forehead relaxed. 4. Hold for __________ seconds. 5. Release the tension slowly. Relax your neck muscles completely before you repeat this exercise. Repeat __________ times. Complete this exercise __________ times a day. Posture and body mechanics Body mechanics refers to the movements and positions of your body while you do your daily activities. Posture is part of body mechanics. Good posture and healthy body mechanics can help to relieve stress in your body's tissues and joints.  Good posture means that your spine is in its natural S-curve position (your spine is neutral), your shoulders are pulled back slightly, and your head is not tipped forward. The following are general guidelines for applying improved posture and body mechanics to your everyday activities. Standing   When standing, keep your spine neutral and keep your feet about hip-width apart. Keep a slight bend in your knees. Your ears, shoulders, and hips  should line up.  When you do a task in which you stand in one place for a long time, place one foot up on a stable object that is 2-4 inches (5-10 cm) high, such as a footstool. This helps keep your spine neutral. Sitting   When sitting, keep your spine neutral and your keep feet flat on the floor. Use a footrest, if necessary, and keep your thighs parallel to the floor. Avoid rounding your shoulders, and avoid tilting your head forward.  When working at a desk or a computer, keep your desk at a height where your hands are slightly lower than your elbows. Slide your chair under your desk so you are close enough to maintain good posture.  When working at a computer, place your monitor at a height where you are looking straight ahead and you do not have to tilt your head forward or downward to look at the screen. Resting When lying down and resting, avoid positions that are most painful for you. Try to support your neck in a neutral position. You can use a contour pillow or a small rolled-up towel. Your pillow should support your neck but not push on it. This information is not intended to replace advice given to you by your health care provider. Make sure you discuss any questions you have with your health care provider. Document Released: 01/01/2005 Document Revised: 09/08/2015 Document Reviewed: 12/08/2014 [employer] Interactive Patient Education  2019 [employer].
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
## 2018-07-20 ENCOUNTER — Other Ambulatory Visit: Payer: Self-pay | Admitting: Family Medicine

## 2018-07-20 DIAGNOSIS — I10 Essential (primary) hypertension: Secondary | ICD-10-CM

## 2018-08-17 ENCOUNTER — Other Ambulatory Visit: Payer: Self-pay | Admitting: Family Medicine

## 2018-08-17 DIAGNOSIS — E039 Hypothyroidism, unspecified: Secondary | ICD-10-CM

## 2018-12-20 ENCOUNTER — Other Ambulatory Visit: Payer: Self-pay | Admitting: Family Medicine

## 2018-12-20 DIAGNOSIS — I10 Essential (primary) hypertension: Secondary | ICD-10-CM

## 2019-04-08 ENCOUNTER — Other Ambulatory Visit: Payer: Self-pay | Admitting: Family Medicine

## 2019-04-08 DIAGNOSIS — K219 Gastro-esophageal reflux disease without esophagitis: Secondary | ICD-10-CM

## 2019-05-05 ENCOUNTER — Other Ambulatory Visit: Payer: Self-pay | Admitting: Unknown Physician Specialty

## 2019-05-05 DIAGNOSIS — E041 Nontoxic single thyroid nodule: Secondary | ICD-10-CM

## 2019-05-08 ENCOUNTER — Ambulatory Visit
Admission: RE | Admit: 2019-05-08 | Discharge: 2019-05-08 | Disposition: A | Discharge status: Home / Self Care | Payer: Managed Care, Other (non HMO) | Source: Ambulatory Visit | Attending: Unknown Physician Specialty | Admitting: Unknown Physician Specialty

## 2019-05-08 ENCOUNTER — Other Ambulatory Visit: Payer: Self-pay

## 2019-05-08 DIAGNOSIS — E041 Nontoxic single thyroid nodule: Secondary | ICD-10-CM | POA: Insufficient documentation

## 2019-05-21 ENCOUNTER — Telehealth: Payer: Self-pay | Admitting: Family Medicine

## 2019-05-21 DIAGNOSIS — Z0000 Encounter for general adult medical examination without abnormal findings: Secondary | ICD-10-CM

## 2019-05-21 DIAGNOSIS — I10 Essential (primary) hypertension: Secondary | ICD-10-CM

## 2019-05-21 DIAGNOSIS — E039 Hypothyroidism, unspecified: Secondary | ICD-10-CM

## 2019-05-21 DIAGNOSIS — E782 Mixed hyperlipidemia: Secondary | ICD-10-CM

## 2019-05-21 NOTE — Telephone Encounter (Signed)
Signed orders added tsh free t4

## 2019-05-23 LAB — CBC WITH DIFFERENTIAL/PLATELET
Basophils Absolute: 0 10*3/uL (ref 0.0–0.2)
Basos: 1 %
EOS (ABSOLUTE): 0.3 10*3/uL (ref 0.0–0.4)
Eos: 4 %
Hematocrit: 44 % (ref 34.0–46.6)
Hemoglobin: 14.3 g/dL (ref 11.1–15.9)
Immature Grans (Abs): 0 10*3/uL (ref 0.0–0.1)
Immature Granulocytes: 0 %
Lymphocytes Absolute: 2.8 10*3/uL (ref 0.7–3.1)
Lymphs: 33 %
MCH: 29.3 pg (ref 26.6–33.0)
MCHC: 32.5 g/dL (ref 31.5–35.7)
MCV: 90 fL (ref 79–97)
Monocytes Absolute: 0.4 10*3/uL (ref 0.1–0.9)
Monocytes: 5 %
Neutrophils Absolute: 5 10*3/uL (ref 1.4–7.0)
Neutrophils: 57 %
Platelets: 273 10*3/uL (ref 150–450)
RBC: 4.88 x10E6/uL (ref 3.77–5.28)
RDW: 13.5 % (ref 11.7–15.4)
WBC: 8.7 10*3/uL (ref 3.4–10.8)

## 2019-05-23 LAB — COMPREHENSIVE METABOLIC PANEL
ALT: 38 IU/L — ABNORMAL HIGH (ref 0–32)
AST: 28 IU/L (ref 0–40)
Albumin/Globulin Ratio: 1.4 (ref 1.2–2.2)
Albumin: 4.2 g/dL (ref 3.8–4.8)
Alkaline Phosphatase: 79 IU/L (ref 39–117)
BUN/Creatinine Ratio: 12 (ref 9–23)
BUN: 9 mg/dL (ref 6–24)
Bilirubin Total: 1.5 mg/dL — ABNORMAL HIGH (ref 0.0–1.2)
CO2: 26 mmol/L (ref 20–29)
Calcium: 9.5 mg/dL (ref 8.7–10.2)
Chloride: 99 mmol/L (ref 96–106)
Creatinine, Ser: 0.76 mg/dL (ref 0.57–1.00)
GFR calc Af Amer: 110 mL/min/{1.73_m2} (ref >59)
GFR calc non Af Amer: 96 mL/min/{1.73_m2} (ref >59)
Globulin, Total: 2.9 g/dL (ref 1.5–4.5)
Glucose: 114 mg/dL — ABNORMAL HIGH (ref 65–99)
Potassium: 3.7 mmol/L (ref 3.5–5.2)
Sodium: 139 mmol/L (ref 134–144)
Total Protein: 7.1 g/dL (ref 6.0–8.5)

## 2019-05-23 LAB — LIPID PANEL
Chol/HDL Ratio: 6.4 ratio — ABNORMAL HIGH (ref 0.0–4.4)
Cholesterol, Total: 193 mg/dL (ref 100–199)
HDL: 30 mg/dL — ABNORMAL LOW (ref >39)
LDL Chol Calc (NIH): 135 mg/dL — ABNORMAL HIGH (ref 0–99)
Triglycerides: 154 mg/dL — ABNORMAL HIGH (ref 0–149)
VLDL Cholesterol Cal: 28 mg/dL (ref 5–40)

## 2019-05-23 LAB — HEMOGLOBIN A1C
Est. average glucose Bld gHb Est-mCnc: 117 mg/dL
Hgb A1c MFr Bld: 5.7 % — ABNORMAL HIGH (ref 4.8–5.6)

## 2019-05-23 LAB — TSH: TSH: 12.7 u[IU]/mL — ABNORMAL HIGH (ref 0.450–4.500)

## 2019-05-23 LAB — T4, FREE: Free T4: 0.88 ng/dL (ref 0.82–1.77)

## 2019-05-25 ENCOUNTER — Other Ambulatory Visit: Payer: Self-pay | Admitting: Family Medicine

## 2019-05-25 ENCOUNTER — Ambulatory Visit (INDEPENDENT_AMBULATORY_CARE_PROVIDER_SITE_OTHER): Payer: Managed Care, Other (non HMO) | Admitting: Family Medicine

## 2019-05-25 ENCOUNTER — Encounter: Payer: Self-pay | Admitting: Family Medicine

## 2019-05-25 ENCOUNTER — Other Ambulatory Visit: Payer: Self-pay

## 2019-05-25 VITALS — BP 119/69 | HR 80 | Temp 97.7°F | Resp 16 | Ht 68.0 in | Wt 291.6 lb

## 2019-05-25 DIAGNOSIS — E042 Nontoxic multinodular goiter: Secondary | ICD-10-CM

## 2019-05-25 DIAGNOSIS — E039 Hypothyroidism, unspecified: Secondary | ICD-10-CM

## 2019-05-25 DIAGNOSIS — R7309 Other abnormal glucose: Secondary | ICD-10-CM

## 2019-05-25 DIAGNOSIS — Z6841 Body Mass Index (BMI) 40.0 and over, adult: Secondary | ICD-10-CM

## 2019-05-25 DIAGNOSIS — Z0000 Encounter for general adult medical examination without abnormal findings: Secondary | ICD-10-CM

## 2019-05-25 DIAGNOSIS — E782 Mixed hyperlipidemia: Secondary | ICD-10-CM | POA: Diagnosis not present

## 2019-05-25 DIAGNOSIS — I10 Essential (primary) hypertension: Secondary | ICD-10-CM

## 2019-05-25 DIAGNOSIS — F4321 Adjustment disorder with depressed mood: Secondary | ICD-10-CM

## 2019-05-25 MED ORDER — LEVOTHYROXINE SODIUM 137 MCG PO TABS: 137.0000 ug | ORAL_TABLET | Freq: Every day | ORAL | 1 refills | Status: DC

## 2019-05-25 NOTE — Progress Notes (Signed)
Subjective:    Patient ID: Ashley Clarke, female    DOB: [birth year], 44 y.o.   MRN: [id number]  Ashley Clarke is a 44 y.o. female presenting on 05/25/2019 for Annual Exam   HPI  Here for Annual Physical and Lab Review.  Adjustment disorder mood Mother passed away 2019-02-20, she has been trying to self manage and cope. She has had some down mood at times but declines depression.  FOLLOW-UP Hypothyroidism / Multinodular Goiter Followed by [facility]NT Dr Tami Ribas, had recent Thyroid US and showed goiter nodules are improving or getting smaller. Now she is having some symptoms of hypothyroidism fatigue tired reduced energy, lab shows TSH up to 12.7 and Free T4 down to 0.88 - Previous doses on Levothyroxine initial was around 88 up to 100 to 112, now had been on 177mcg daily Admits feels tired, reduced energy and hypothyroid symptoms  Morbid Obesity BMI >44 Weight gain 10 lbs in 1 year, unable to proceed with regular lifestyle interventions, covid limitations and other life stressors  HYPERLIPIDEMIA: - Reports no concerns. Last lipid panel 05/2019, normal total chol, mild low HDL, slight elevated TG 154, and LDL at 135 elevated Not on statin therapy  Elevated A1c Recent lab A1c 5.7, previously 5.3 to 5.5 range. Not following any particular diet plan. Limited regular exercise  CHRONIC HTN: Reports no concerns. Controlled Current Meds - Losartan 100mg  daily, HCTZ 25mg  daily   Reports good compliance, took meds today. Tolerating well, w/o complaints. Denies CP, dyspnea, HA, edema, dizziness / lightheadedness  Tobacco Abuse Still active smoker, she is down to weaning cigarettes down to 5-6 per day, she still has problem with stressful parts of day morning. Has been successful in reducing, and wants to decline NRT or medication at this time.  Health Maintenance: Previously GYN health with [facility]. She will return for pap smear. Last done 04/2014. She is  due.    Depression screen [facility] 2/9 05/25/2019 11/01/2017 07/05/2017  Decreased Interest 1 0 0  Down, Depressed, Hopeless 1 0 0  PHQ - 2 Score 2 0 0  Altered sleeping 2 - -  Tired, decreased energy 1 - -  Change in appetite 2 - -  Feeling bad or failure about yourself  1 - -  Trouble concentrating 1 - -  Moving slowly or fidgety/restless 1 - -  Suicidal thoughts 0 - -  PHQ-9 Score 10 - -  Difficult doing work/chores Somewhat difficult - -    Past Medical History:  Diagnosis Date  . Hyperlipidemia   . Hypertension   . Hypothyroidism   . PONV (postoperative nausea and vomiting)    Past Surgical History:  Procedure Laterality Date  . APPENDECTOMY    . DG GALL BLADDER    . KNEE SURGERY    . SUPRACERVICAL ABDOMINAL HYSTERECTOMY  2014   reported by patient, do not have op report  . TONSILLECTOMY     Social History   Socioeconomic History  . Marital status: Divorced    Spouse name: Not on file  . Number of children: Not on file  . Years of education: Not on file  . Highest education level: Not on file  Occupational History  . Occupation: Armed forces logistics/support/administrative officer  Tobacco Use  . Smoking status: Current Every Day Smoker    Packs/day: 0.25    Years: 30.00    Pack years: 7.50    Types: Cigarettes  . Smokeless tobacco: Never Used  . Tobacco comment: < 0.5ppd  avg smoking  Substance and Sexual Activity  . Alcohol use: No    Alcohol/week: 0.0 standard drinks  . Drug use: No  . Sexual activity: Not on file  Other Topics Concern  . Not on file  Social History Narrative  . Not on file   Social Determinants of Health   Financial Resource Strain:   . Difficulty of Paying Living Expenses:   Food Insecurity:   . Worried About Charity fundraiser in the Last Year:   . Arboriculturist in the Last Year:   Transportation Needs:   . Film/video editor (Medical):   Marland Kitchen Lack of Transportation (Non-Medical):   Physical Activity:   . Days of Exercise per Week:   . Minutes of  Exercise per Session:   Stress:   . Feeling of Stress :   Social Connections:   . Frequency of Communication with Friends and Family:   . Frequency of Social Gatherings with Friends and Family:   . Attends Religious Services:   . Active Member of Clubs or Organizations:   . Attends Archivist Meetings:   Marland Kitchen Marital Status:   Intimate Partner Violence:   . Fear of Current or Ex-Partner:   . Emotionally Abused:   Marland Kitchen Physically Abused:   . Sexually Abused:    Family History  Problem Relation Age of Onset  . Stroke Paternal Aunt   . Breast cancer Paternal Aunt 57  . Stroke Maternal Grandmother   . Colon cancer Neg Hx    Current Outpatient Medications on File Prior to Visit  Medication Sig  . baclofen (LIORESAL) 10 MG tablet Take 0.5-1 tablets (5-10 mg total) by mouth 3 (three) times daily as needed for muscle spasms.  . fluticasone (FLONASE) 50 MCG/ACT nasal spray Place 2 sprays into both nostrils as needed.   . hydrochlorothiazide (HYDRODIURIL) 25 MG tablet TAKE 1 TABLET BY MOUTH  DAILY  . losartan (COZAAR) 100 MG tablet TAKE 1 TABLET BY MOUTH  DAILY  . omeprazole (PRILOSEC) 40 MG capsule TAKE 1 CAPSULE BY MOUTH  DAILY  . albuterol (PROAIR HFA) 108 (90 BASE) MCG/ACT inhaler Inhale 2 puffs into the lungs every 6 (six) hours as needed.    No current facility-administered medications on file prior to visit.    Review of Systems  Constitutional: Negative for activity change, appetite change, chills, diaphoresis, fatigue and fever.  HENT: Negative for congestion and hearing loss.   Eyes: Negative for visual disturbance.  Respiratory: Negative for apnea, cough, chest tightness, shortness of breath and wheezing.   Cardiovascular: Negative for chest pain, palpitations and leg swelling.  Gastrointestinal: Negative for abdominal pain, anal bleeding, blood in stool, constipation, diarrhea, nausea and vomiting.  Endocrine: Negative for cold intolerance.  Genitourinary: Negative  for difficulty urinating, dysuria, frequency and hematuria.  Musculoskeletal: Negative for arthralgias, back pain and neck pain.  Skin: Negative for rash.  Allergic/Immunologic: Negative for environmental allergies.  Neurological: Negative for dizziness, weakness, light-headedness, numbness and headaches.  Hematological: Negative for adenopathy.  Psychiatric/Behavioral: Positive for decreased concentration and dysphoric mood. Negative for agitation, behavioral problems, self-injury, sleep disturbance and suicidal ideas. The patient is not nervous/anxious.    Per HPI unless specifically indicated above     Objective:    BP 119/69   Pulse 80   Temp 97.7 F (36.5 C) (Temporal)   Resp 16   Ht 5\' 8"  (1.727 m)   Wt 291 lb 9.6 oz (132.3 kg)   SpO2 99%  BMI 44.34 kg/m   Wt Readings from Last 3 Encounters:  05/25/19 291 lb 9.6 oz (132.3 kg)  07/10/18 281 lb 3.2 oz (127.6 kg)  06/27/18 250 lb (113.4 kg)    Physical Exam Vitals and nursing note reviewed.  Constitutional:      General: She is not in acute distress.    Appearance: She is well-developed. She is obese. She is not diaphoretic.     Comments: Well-appearing, comfortable, cooperative  HENT:     Head: Normocephalic and atraumatic.  Eyes:     General:        Right eye: No discharge.        Left eye: No discharge.     Conjunctiva/sclera: Conjunctivae normal.     Pupils: Pupils are equal, round, and reactive to light.  Neck:     Thyroid: No thyromegaly.  Cardiovascular:     Rate and Rhythm: Normal rate and regular rhythm.     Heart sounds: Normal heart sounds. No murmur.  Pulmonary:     Effort: Pulmonary effort is normal. No respiratory distress.     Breath sounds: Normal breath sounds. No wheezing or rales.  Abdominal:     General: Bowel sounds are normal. There is no distension.     Palpations: Abdomen is soft. There is no mass.     Tenderness: There is no abdominal tenderness.  Musculoskeletal:        General: No  tenderness. Normal range of motion.     Cervical back: Normal range of motion and neck supple.     Comments: Upper / Lower Extremities: - Normal muscle tone, strength bilateral upper extremities 5/5, lower extremities 5/5  Lymphadenopathy:     Cervical: No cervical adenopathy.  Skin:    General: Skin is warm and dry.     Findings: No erythema or rash.  Neurological:     Mental Status: She is alert and oriented to person, place, and time.     Comments: Distal sensation intact to light touch all extremities  Psychiatric:        Behavior: Behavior normal.     Comments: Well groomed, good eye contact, normal speech and thoughts       Results for orders placed or performed in visit on 05/21/19  CBC with Differential/Platelet  Result Value Ref Range   WBC 8.7 3.4 - 10.8 x10E3/uL   RBC 4.88 3.77 - 5.28 x10E6/uL   Hemoglobin 14.3 11.1 - 15.9 g/dL   Hematocrit 44.0 34.0 - 46.6 %   MCV 90 79 - 97 fL   MCH 29.3 26.6 - 33.0 pg   MCHC 32.5 31.5 - 35.7 g/dL   RDW 13.5 11.7 - 15.4 %   Platelets 273 150 - 450 x10E3/uL   Neutrophils 57 Not Estab. %   Lymphs 33 Not Estab. %   Monocytes 5 Not Estab. %   Eos 4 Not Estab. %   Basos 1 Not Estab. %   Neutrophils Absolute 5.0 1.4 - 7.0 x10E3/uL   Lymphocytes Absolute 2.8 0.7 - 3.1 x10E3/uL   Monocytes Absolute 0.4 0.1 - 0.9 x10E3/uL   EOS (ABSOLUTE) 0.3 0.0 - 0.4 x10E3/uL   Basophils Absolute 0.0 0.0 - 0.2 x10E3/uL   Immature Granulocytes 0 Not Estab. %   Immature Grans (Abs) 0.0 0.0 - 0.1 x10E3/uL  Lipid panel  Result Value Ref Range   Cholesterol, Total 193 100 - 199 mg/dL   Triglycerides 154 (H) 0 - 149 mg/dL   HDL 30 (L) >  39 mg/dL   VLDL Cholesterol Cal 28 5 - 40 mg/dL   LDL Chol Calc (NIH) 135 (H) 0 - 99 mg/dL   Chol/HDL Ratio 6.4 (H) 0.0 - 4.4 ratio  Hemoglobin A1c  Result Value Ref Range   Hgb A1c MFr Bld 5.7 (H) 4.8 - 5.6 %   Est. average glucose Bld gHb Est-mCnc 117 mg/dL  Comprehensive Metabolic Panel (CMET)  Result Value  Ref Range   Glucose 114 (H) 65 - 99 mg/dL   BUN 9 6 - 24 mg/dL   Creatinine, Ser 0.76 0.57 - 1.00 mg/dL   GFR calc non Af Amer 96 >59 mL/min/1.73   GFR calc Af Amer 110 >59 mL/min/1.73   BUN/Creatinine Ratio 12 9 - 23   Sodium 139 134 - 144 mmol/L   Potassium 3.7 3.5 - 5.2 mmol/L   Chloride 99 96 - 106 mmol/L   CO2 26 20 - 29 mmol/L   Calcium 9.5 8.7 - 10.2 mg/dL   Total Protein 7.1 6.0 - 8.5 g/dL   Albumin 4.2 3.8 - 4.8 g/dL   Globulin, Total 2.9 1.5 - 4.5 g/dL   Albumin/Globulin Ratio 1.4 1.2 - 2.2   Bilirubin Total 1.5 (H) 0.0 - 1.2 mg/dL   Alkaline Phosphatase 79 39 - 117 IU/L   AST 28 0 - 40 IU/L   ALT 38 (H) 0 - 32 IU/L  TSH  Result Value Ref Range   TSH 12.700 (H) 0.450 - 4.500 uIU/mL  T4, free  Result Value Ref Range   Free T4 0.88 0.82 - 1.77 ng/dL      Assessment & Plan:   Problem List Items Addressed This Visit    Multinodular goiter    Stable without recurrence Followed by [facility] Dr Tami Ribas Last Thyroid ultrasound recently unremarkable for nodules, had improved      Relevant Medications   levothyroxine (SYNTHROID) 137 MCG tablet   Morbid obesity with BMI of 40.0-44.9, adult (HCC)    Increase weight to BMI >44, 10 lbs in 1 year Encourage improve diet and exercise goal weight loss      Hyperlipidemia    Suboptimal control lipids, improve lifestyle. Elevated LDL Last lipid panel 05/2019  Plan: No medication indicated Encourage improved lifestyle - low carb/cholesterol, reduce portion size, continue improving regular exercise      Essential (primary) hypertension    Well-controlled HTN - Home BP readings improved No known complications   Plan:  1. Continue current BP regimen - Losartan 100mg  daily, HCTZ 25mg  daily (also for edema) 2. Encourage improved lifestyle - low sodium diet, keep improve regular exercise 3. Continue to monitor BP outside office, bring readings to next visit, if persistently >140/90 or new symptoms notify office sooner       Adult hypothyroidism    Notable increase in TSH and low T4 with worsening control hypothyroid Symptomatic - See A&P: Complicated by multinodular goiter, followed by ENT Tami Ribas for neck US and biopsies, negative in past, reported 3 nodules on R and 2 on L  Plan: 1. Increase doseLevothyroxine from 159mcg daily to 118mcg daily - new rx sent 2. Future will check labs again [facility] in 3 months. Printed given to patient.      Relevant Medications   levothyroxine (SYNTHROID) 137 MCG tablet    Other Visit Diagnoses    Annual physical exam    -  Primary   Adjustment disorder with depressed mood          Updated Health Maintenance  information Reviewed recent lab results with patient Encouraged improvement to lifestyle with diet and exercise - Goal of weight loss  #Adjustment disorder, depressed mood. Underlying trigger with loss of mother recently and covid No prior significant depression history Overall gradual improvement Treatment declined and encourage continue current course - can offer therapy if indicated   Meds ordered this encounter  Medications  . levothyroxine (SYNTHROID) 137 MCG tablet    Sig: Take 1 tablet (137 mcg total) by mouth daily before breakfast.    Dispense:  90 tablet    Refill:  1    Dose increase from 125 up to 137     Follow up plan: Return in about 6 months (around 11/25/2019) for 6 month Follow-up Thyroid, Blood Sugar, Cholesterol.   Printed [facility] orders in 3 months TSH A1c, given to patient.  Nobie Putnam, [facility] 05/25/2019, 11:31 AM

## 2019-05-25 NOTE — Patient Instructions (Addendum)
Thank you for coming to the office today.  Recommend routine pap smear done last 2016, you are due for next screening for cervical cancer. Please call [facility] to schedule a pap smear.  No medicine for mood, likely adjustment disorder to life stressor, goal to keep on monitoring this for now and can follow-up if not resolving.  Elevated cholesterol and A1c sugar.  Recommend low carb, low starch diet. Also can limit dietary cholesterol.  Goal to improve regular physical activity and exercise to help weight, sugar and cholesterol  Thyroid labs were OFF and now time to adjust dose, Levothyroxine up from 125 up to 148mcg once daily  Labs in 3 months, for thyroid and A1c at [facility], we can review results and notify you if need to change any treatment.   Please schedule a Follow-up Appointment to: Return in about 6 months (around 11/25/2019) for 6 month Follow-up Thyroid, Blood Sugar, Cholesterol.  If you have any other questions or concerns, please feel free to call the office or send a message through [place]. You may also schedule an earlier appointment if necessary.  Additionally, you may be receiving a survey about your experience at our office within a few days to 1 week by e-mail or mail. We value your feedback.  Nobie Putnam, DO [place]

## 2019-05-26 NOTE — Assessment & Plan Note (Signed)
Stable without recurrence Followed by [facility] ENT Dr Tami Ribas Last Thyroid ultrasound recently unremarkable for nodules, had improved

## 2019-05-26 NOTE — Assessment & Plan Note (Signed)
Suboptimal control lipids, improve lifestyle. Elevated LDL Last lipid panel 05/2019  Plan: No medication indicated Encourage improved lifestyle - low carb/cholesterol, reduce portion size, continue improving regular exercise

## 2019-05-26 NOTE — Assessment & Plan Note (Signed)
Well-controlled HTN - Home BP readings improved No known complications   Plan:  1. Continue current BP regimen - Losartan 100mg  daily, HCTZ 25mg  daily (also for edema) 2. Encourage improved lifestyle - low sodium diet, keep improve regular exercise 3. Continue to monitor BP outside office, bring readings to next visit, if persistently >140/90 or new symptoms notify office sooner

## 2019-05-26 NOTE — Assessment & Plan Note (Signed)
Notable increase in TSH and low T4 with worsening control hypothyroid Symptomatic - See A&P: Complicated by multinodular goiter, followed by ENT Tami Ribas for neck US and biopsies, negative in past, reported 3 nodules on R and 2 on L  Plan: 1. Increase doseLevothyroxine from 140mcg daily to 132mcg daily - new rx sent 2. Future will check labs again [facility] in 3 months. Printed given to patient.

## 2019-05-26 NOTE — Assessment & Plan Note (Signed)
Increase weight to BMI >44, 10 lbs in 1 year Encourage improve diet and exercise goal weight loss

## 2019-06-14 ENCOUNTER — Other Ambulatory Visit: Payer: Self-pay | Admitting: Family Medicine

## 2019-06-14 DIAGNOSIS — I10 Essential (primary) hypertension: Secondary | ICD-10-CM

## 2019-10-08 ENCOUNTER — Other Ambulatory Visit: Payer: Self-pay | Admitting: Family Medicine

## 2019-10-08 DIAGNOSIS — E039 Hypothyroidism, unspecified: Secondary | ICD-10-CM

## 2019-10-08 NOTE — Telephone Encounter (Signed)
Requested  medications are  due for refill today yes  Requested medications are on the active medication list yes  Last refill 7/30  Last visit May  Future visit scheduled No  Notes to clinic Labs from 5/10 were not completed, and note states Dr Parks Ranger wanted labs repeated in 3 months, Aug.

## 2019-10-24 ENCOUNTER — Other Ambulatory Visit: Payer: Self-pay | Admitting: Family Medicine

## 2019-10-24 DIAGNOSIS — E039 Hypothyroidism, unspecified: Secondary | ICD-10-CM

## 2019-10-24 NOTE — Telephone Encounter (Signed)
Requested medications are due for refill today? Yes   Requested medications are on active medication list?  Yes  Last Refill:   05/25/2019  # 90 with one refill   Future visit scheduled?  No   Notes to Clinic:  Medication failed Rx refill protocol due to no TSH recheck in 3 months after an abnormal result on 05/22/2019.

## 2019-11-03 ENCOUNTER — Ambulatory Visit (INDEPENDENT_AMBULATORY_CARE_PROVIDER_SITE_OTHER): Payer: Managed Care, Other (non HMO) | Admitting: Family Medicine

## 2019-11-03 ENCOUNTER — Other Ambulatory Visit: Payer: Self-pay

## 2019-11-03 ENCOUNTER — Encounter: Payer: Self-pay | Admitting: Family Medicine

## 2019-11-03 VITALS — BP 117/83 | HR 92 | Temp 97.5°F | Resp 16 | Ht 68.0 in | Wt 292.6 lb

## 2019-11-03 DIAGNOSIS — Z6841 Body Mass Index (BMI) 40.0 and over, adult: Secondary | ICD-10-CM

## 2019-11-03 DIAGNOSIS — R7309 Other abnormal glucose: Secondary | ICD-10-CM

## 2019-11-03 DIAGNOSIS — Z720 Tobacco use: Secondary | ICD-10-CM

## 2019-11-03 DIAGNOSIS — E039 Hypothyroidism, unspecified: Secondary | ICD-10-CM

## 2019-11-03 NOTE — Patient Instructions (Addendum)
Thank you for coming to the office today.  Keep up current regimen with goal to improve regular activity, exercise treadmill.  Check out noom.com if you want more access to good nutrition lifestyle information all in one place, good for keeping track of diet exercise, calories, and recipes and other healthy advice.  Keep chipping away at the smoking.  [facility] orders - FASTING next few days.  Please schedule a Follow-up Appointment to: Return in about 6 months (around 05/03/2020) for 6 month Annual Physical (labs through [facility]).  If you have any other questions or concerns, please feel free to call the office or send a message through [place]. You may also schedule an earlier appointment if necessary.  Additionally, you may be receiving a survey about your experience at our office within a few days to 1 week by e-mail or mail. We value your feedback.  Nobie Putnam, DO [place]

## 2019-11-03 NOTE — Progress Notes (Signed)
Subjective:    Patient ID: Ashley Clarke, female    DOB: [birth year], 44 y.o.   MRN: [id number]  Dima Mini is a 44 y.o. female presenting on 11/03/2019 for insurance appeals for BMI, Obesity, and Nicotine Dependence   HPI  Morbid Obesity BMI >44  She is going to start using treadmill exercise equipment while staying with her father right now.  Diet changes, less fried, fast food, junk food, and   Weight has fluctuated in past 5-6 months.  Last A1c 5.7  Adjustment disorder mood Mother passed away 2019/02/15, she has been trying to self manage and cope. She has had some down mood at times but declines depression.    FOLLOW-UPHypothyroidism / Multinodular Goiter Followed by [facility] ENT Dr Tami Ribas, had recent Thyroid US and showed goiter nodules are improving or getting smaller. Last visit 05/2019 annual had TSH up to 12 and dose Levothyroxine was increased from 125 up to 137 mcg daily. She can feel some slight improvement. Now due for thyroid panel.   Tobacco Abuse Still active smoker, she is down to weaning cigarettes down to 5-6 per day, she still has problem with stressful parts of day morning. Has been successful in reducing, and wants to decline NRT or medication at this time. goal to continue cut back to cold [place] quit within 3 months, goals to quit smoking and lose weight by son's wedding April 2022     Depression screen [facility] 2/9 11/03/2019 05/25/2019 11/01/2017  Decreased Interest 0 1 0  Down, Depressed, Hopeless 0 1 0  PHQ - 2 Score 0 2 0  Altered sleeping - 2 -  Tired, decreased energy - 1 -  Change in appetite - 2 -  Feeling bad or failure about yourself  - 1 -  Trouble concentrating - 1 -  Moving slowly or fidgety/restless - 1 -  Suicidal thoughts - 0 -  PHQ-9 Score - 10 -  Difficult doing work/chores - Somewhat difficult -    Social History   Tobacco Use  . Smoking status: Current Every Day Smoker    Packs/day: 0.25    Years: 30.00      Pack years: 7.50    Types: Cigarettes  . Smokeless tobacco: Never Used  . Tobacco comment: < 0.5ppd avg smoking  Vaping Use  . Vaping Use: Never used  Substance Use Topics  . Alcohol use: No    Alcohol/week: 0.0 standard drinks  . Drug use: No    Review of Systems Per HPI unless specifically indicated above     Objective:    BP 117/83   Pulse 92   Temp (!) 97.5 F (36.4 C) (Temporal)   Resp 16   Ht 5\' 8"  (1.727 m)   Wt 292 lb 9.6 oz (132.7 kg)   SpO2 98%   BMI 44.49 kg/m   Wt Readings from Last 3 Encounters:  11/03/19 292 lb 9.6 oz (132.7 kg)  05/25/19 291 lb 9.6 oz (132.3 kg)  07/10/18 281 lb 3.2 oz (127.6 kg)    Physical Exam Vitals and nursing note reviewed.  Constitutional:      General: She is not in acute distress.    Appearance: She is well-developed. She is obese. She is not diaphoretic.     Comments: Well-appearing, comfortable, cooperative  HENT:     Head: Normocephalic and atraumatic.  Eyes:     General:        Right eye: No discharge.  Left eye: No discharge.     Conjunctiva/sclera: Conjunctivae normal.  Neck:     Thyroid: No thyromegaly.  Cardiovascular:     Rate and Rhythm: Normal rate and regular rhythm.     Heart sounds: Normal heart sounds. No murmur heard.   Pulmonary:     Effort: Pulmonary effort is normal. No respiratory distress.     Breath sounds: Normal breath sounds. No wheezing or rales.  Musculoskeletal:        General: Normal range of motion.     Cervical back: Normal range of motion and neck supple.  Lymphadenopathy:     Cervical: No cervical adenopathy.  Skin:    General: Skin is warm and dry.     Findings: No erythema or rash.  Neurological:     Mental Status: She is alert and oriented to person, place, and time.  Psychiatric:        Behavior: Behavior normal.     Comments: Well groomed, good eye contact, normal speech and thoughts      Results for orders placed or performed in visit on 05/21/19  CBC with  Differential/Platelet  Result Value Ref Range   WBC 8.7 3.4 - 10.8 x10E3/uL   RBC 4.88 3.77 - 5.28 x10E6/uL   Hemoglobin 14.3 11.1 - 15.9 g/dL   Hematocrit 44.0 34.0 - 46.6 %   MCV 90 79 - 97 fL   MCH 29.3 26.6 - 33.0 pg   MCHC 32.5 31 - 35 g/dL   RDW 13.5 11.7 - 15.4 %   Platelets 273 150 - 450 x10E3/uL   Neutrophils 57 Not Estab. %   Lymphs 33 Not Estab. %   Monocytes 5 Not Estab. %   Eos 4 Not Estab. %   Basos 1 Not Estab. %   Neutrophils Absolute 5.0 1.40 - 7.00 x10E3/uL   Lymphocytes Absolute 2.8 0 - 3 x10E3/uL   Monocytes Absolute 0.4 0 - 0 x10E3/uL   EOS (ABSOLUTE) 0.3 0.0 - 0.4 x10E3/uL   Basophils Absolute 0.0 0 - 0 x10E3/uL   Immature Granulocytes 0 Not Estab. %   Immature Grans (Abs) 0.0 0.0 - 0.1 x10E3/uL  Lipid panel  Result Value Ref Range   Cholesterol, Total 193 100 - 199 mg/dL   Triglycerides 154 (H) 0 - 149 mg/dL   HDL 30 (L) >39 mg/dL   VLDL Cholesterol Cal 28 5 - 40 mg/dL   LDL Chol Calc (NIH) 135 (H) 0 - 99 mg/dL   Chol/HDL Ratio 6.4 (H) 0.0 - 4.4 ratio  Hemoglobin A1c  Result Value Ref Range   Hgb A1c MFr Bld 5.7 (H) 4.8 - 5.6 %   Est. average glucose Bld gHb Est-mCnc 117 mg/dL  Comprehensive Metabolic Panel (CMET)  Result Value Ref Range   Glucose 114 (H) 65 - 99 mg/dL   BUN 9 6 - 24 mg/dL   Creatinine, Ser 0.76 0.57 - 1.00 mg/dL   GFR calc non Af Amer 96 >59 mL/min/1.73   GFR calc Af Amer 110 >59 mL/min/1.73   BUN/Creatinine Ratio 12 9 - 23   Sodium 139 134 - 144 mmol/L   Potassium 3.7 3.5 - 5.2 mmol/L   Chloride 99 96 - 106 mmol/L   CO2 26 20 - 29 mmol/L   Calcium 9.5 8.7 - 10.2 mg/dL   Total Protein 7.1 6.0 - 8.5 g/dL   Albumin 4.2 3.8 - 4.8 g/dL   Globulin, Total 2.9 1.5 - 4.5 g/dL   Albumin/Globulin Ratio 1.4 1.2 -  2.2   Bilirubin Total 1.5 (H) 0.0 - 1.2 mg/dL   Alkaline Phosphatase 79 39 - 117 IU/L   AST 28 0 - 40 IU/L   ALT 38 (H) 0 - 32 IU/L  TSH  Result Value Ref Range   TSH 12.700 (H) 0.450 - 4.500 uIU/mL  T4, free  Result  Value Ref Range   Free T4 0.88 0.82 - 1.77 ng/dL      Assessment & Plan:   Problem List Items Addressed This Visit    Tobacco abuse   Morbid obesity with BMI of 40.0-44.9, adult (HCC) - Primary    Weight slight gain in 6 months Still BMI >44 abnormal weight Counseling on physician guided diet regimen      Relevant Orders   Comprehensive metabolic panel   Adult hypothyroidism    Due for lab On dose levothyroxine 137 now, will see if TSH has resolved      Relevant Orders   TSH   T4, free   Comprehensive metabolic panel    Other Visit Diagnoses    Elevated hemoglobin A1c       Relevant Orders   Hemoglobin A1c   Comprehensive metabolic panel      #Tobacco cessation counseling, taper cold [place] plan, declines NRT or med  Completed biometric exception Form BMI / Tobacco  No orders of the defined types were placed in this encounter.   Orders Placed This Encounter  Procedures  . TSH  . T4, free  . Hemoglobin A1c  . Comprehensive metabolic panel    Order Specific Question:   Has the patient fasted?    Answer:   Yes     Follow up plan: Return in about 6 months (around 05/03/2020) for 6 month Annual Physical (labs through [place]).  Future [facility] orders when ready  Nobie Putnam, DO [facility] 11/03/2019, 4:13 PM

## 2019-11-04 NOTE — Addendum Note (Signed)
Addended by: Olin Hauser on: 11/04/2019 07:15 AM   Modules accepted: Level of Service

## 2019-11-04 NOTE — Assessment & Plan Note (Signed)
Due for lab On dose levothyroxine 137 now, will see if TSH has resolved

## 2019-11-04 NOTE — Assessment & Plan Note (Signed)
Weight slight gain in 6 months Still BMI >44 abnormal weight Counseling on physician guided diet regimen

## 2019-11-12 ENCOUNTER — Other Ambulatory Visit: Payer: Self-pay | Admitting: Family Medicine

## 2019-11-12 DIAGNOSIS — I10 Essential (primary) hypertension: Secondary | ICD-10-CM

## 2020-02-17 ENCOUNTER — Other Ambulatory Visit: Payer: Self-pay | Admitting: Family Medicine

## 2020-02-17 DIAGNOSIS — E039 Hypothyroidism, unspecified: Secondary | ICD-10-CM

## 2020-02-17 DIAGNOSIS — K219 Gastro-esophageal reflux disease without esophagitis: Secondary | ICD-10-CM

## 2020-02-17 NOTE — Telephone Encounter (Signed)
Copied from [place] [id number]. Topic: Quick Communication - Rx Refill/Question >> Feb 17, 2020 12:11 PM Leward Quan A wrote: Medication: omeprazole (PRILOSEC) 40 MG capsule, levothyroxine (SYNTHROID) 137 MCG tablet   Has the patient contacted their pharmacy? Yes.   (Agent: If no, request that the patient contact the pharmacy for the refill.) (Agent: If yes, when and what did the pharmacy advise?)  Preferred Pharmacy (with phone number or street name): [place], [address], [address]  Phone:  [phone] Fax:  [phone]     Agent: Please be advised that RX refills may take up to 3 business days. We ask that you follow-up with your pharmacy.

## 2020-02-17 NOTE — Telephone Encounter (Signed)
Needs future visit scheduled.

## 2020-02-17 NOTE — Telephone Encounter (Signed)
Requested medication (s) are due for refill today: yes   Requested medication (s) are on the active medication list: yes  Last refill:  05/25/19 #90 1 refill  Future visit scheduled: no   Notes to clinic:  overdue labs . TSH no rechecked w/I 3 months of abnormal lab. Last lab 05/22/19. Do you want to continue refill?     Requested Prescriptions  Pending Prescriptions Disp Refills   levothyroxine (SYNTHROID) 137 MCG tablet [Pharmacy Med Name: MYLAN-LEVOTHYROX 137MCG TABLET] 90 tablet 3    Sig: TAKE 1 TABLET BY MOUTH  DAILY BEFORE BREAKFAST      Endocrinology:  Hypothyroid Agents Failed - 02/17/2020 12:15 PM      Failed - TSH needs to be rechecked within 3 months after an abnormal result. Refill until TSH is due.      Failed - TSH in normal range and within 360 days    TSH  Date Value Ref Range Status  05/22/2019 12.700 (H) 0.450 - 4.500 uIU/mL Final          Passed - Valid encounter within last 12 months    Recent Outpatient Visits           3 months ago Morbid obesity with BMI of 40.0-44.9, adult [facility])   [facility] [place], Devonne Doughty, DO   8 months ago Annual physical exam   [place], DO   1 year ago Acute pain of left shoulder   [place], Devonne Doughty, DO   2 years ago Morbid obesity with BMI of 40.0-44.9, adult [facility])   [place], Devonne Doughty, DO   2 years ago Annual physical exam   [place], DO                 Signed Prescriptions Disp Refills   omeprazole (PRILOSEC) 40 MG capsule 90 capsule 0    Sig: TAKE 1 CAPSULE BY MOUTH  DAILY      Gastroenterology: Proton Pump Inhibitors Passed - 02/17/2020 12:15 PM      Passed - Valid encounter within last 12 months    Recent Outpatient Visits           3 months ago Morbid obesity with BMI of 40.0-44.9, adult [facility])   [place], DO   8 months ago Annual physical exam   [place], DO   1 year ago Acute pain of left shoulder   [place], Devonne Doughty, DO   2 years ago Morbid obesity with BMI of 40.0-44.9, adult [facility])   [facility] Olin Hauser, DO   2 years ago Annual physical exam   [place], Devonne Doughty, DO

## 2020-02-18 ENCOUNTER — Other Ambulatory Visit: Payer: Self-pay

## 2020-02-18 DIAGNOSIS — E782 Mixed hyperlipidemia: Secondary | ICD-10-CM

## 2020-02-18 DIAGNOSIS — E039 Hypothyroidism, unspecified: Secondary | ICD-10-CM

## 2020-02-18 NOTE — Telephone Encounter (Signed)
The pt was scheduled for a lab appt to get her TSH, T4 checked on tomorrow in order to get her refills on her Levothyroxine. She work for [employer], but decided to get the TSH, T4 done at our office for convenience. She will hold off on the other labs to get them drawn by [facility].

## 2020-02-19 ENCOUNTER — Other Ambulatory Visit: Payer: Managed Care, Other (non HMO)

## 2020-02-19 ENCOUNTER — Other Ambulatory Visit: Payer: Self-pay

## 2020-02-20 LAB — T4: T4, Total: 10.3 ug/dL (ref 5.1–11.9)

## 2020-02-20 LAB — TSH: TSH: 4.52 mIU/L — ABNORMAL HIGH

## 2020-02-23 NOTE — Telephone Encounter (Signed)
The pt had her labs drawn and need refill on medications.

## 2020-03-08 ENCOUNTER — Telehealth: Payer: Self-pay

## 2020-03-08 ENCOUNTER — Other Ambulatory Visit: Payer: Self-pay | Admitting: Family Medicine

## 2020-03-08 DIAGNOSIS — E039 Hypothyroidism, unspecified: Secondary | ICD-10-CM

## 2020-03-08 MED ORDER — LEVOTHYROXINE SODIUM 137 MCG PO TABS: 137.0000 ug | ORAL_TABLET | Freq: Every day | ORAL | 0 refills | Status: DC

## 2020-03-08 NOTE — Telephone Encounter (Signed)
Copied from [place] [id number]. Topic: General - Other >> Mar 08, 2020 10:49 AM Yvette Rack wrote: Reason for CRM: Pt stated she has been without her medication for over a week now and she would like to know if a partial prescription for levothyroxine (SYNTHROID) 137 MCG tablet could be sent to [place], [place] AT [place]

## 2020-03-08 NOTE — Telephone Encounter (Signed)
I attempted to contact the patient, no answer. I left a detail message on the patient vm.

## 2020-04-27 ENCOUNTER — Other Ambulatory Visit: Payer: Self-pay | Admitting: Family Medicine

## 2020-04-27 DIAGNOSIS — I10 Essential (primary) hypertension: Secondary | ICD-10-CM

## 2020-04-27 NOTE — Telephone Encounter (Signed)
Patient needs to make a follow up appointment. Courtesy refill. Requested Prescriptions  Pending Prescriptions Disp Refills  . hydrochlorothiazide (HYDRODIURIL) 25 MG tablet [Pharmacy Med Name: hydroCHLOROthiazide 25 MG Oral Tablet] 30 tablet 0    Sig: TAKE 1 TABLET BY MOUTH  DAILY     Cardiovascular: Diuretics - Thiazide Passed - 04/27/2020  9:26 PM      Passed - Ca in normal range and within 360 days    Calcium  Date Value Ref Range Status  05/22/2019 9.5 8.7 - 10.2 mg/dL Final         Passed - Cr in normal range and within 360 days    Creat  Date Value Ref Range Status  03/13/2016 0.81 0.50 - 1.10 mg/dL Final   Creatinine, Ser  Date Value Ref Range Status  05/22/2019 0.76 0.57 - 1.00 mg/dL Final         Passed - K in normal range and within 360 days    Potassium  Date Value Ref Range Status  05/22/2019 3.7 3.5 - 5.2 mmol/L Final  05/22/2011 3.2 (L) 3.5 - 5.1 mmol/L Final    Comment:    POTASSIUM - Slight hemolysis, interpret results with  - caution.          Passed - Na in normal range and within 360 days    Sodium  Date Value Ref Range Status  05/22/2019 139 134 - 144 mmol/L Final         Passed - Last BP in normal range    BP Readings from Last 1 Encounters:  11/03/19 117/83         Passed - Valid encounter within last 6 months    Recent Outpatient Visits          5 months ago Morbid obesity with BMI of 40.0-44.9, adult [facility])   [facility] Olin Hauser, DO   11 months ago Annual physical exam   [place], DO   1 year ago Acute pain of left shoulder   [facility] [place], Devonne Doughty, DO   2 years ago Morbid obesity with BMI of 40.0-44.9, adult [facility])   [facility] Olin Hauser, DO   2 years ago Annual physical exam   [place], Devonne Doughty, DO

## 2020-05-14 ENCOUNTER — Other Ambulatory Visit: Payer: Self-pay | Admitting: Family Medicine

## 2020-05-14 DIAGNOSIS — I10 Essential (primary) hypertension: Secondary | ICD-10-CM

## 2020-05-15 NOTE — Telephone Encounter (Signed)
Pt past due for appointment to f/u on HTN/Losartan.  Message sent to pt via [place]. Last refilled 06/14/19 #90 3 refills.   Requested Prescriptions  Pending Prescriptions Disp Refills  . losartan (COZAAR) 100 MG tablet [Pharmacy Med Name: Losartan Potassium 100 MG Oral Tablet] 90 tablet 3    Sig: TAKE 1 TABLET BY MOUTH  DAILY     Cardiovascular:  Angiotensin Receptor Blockers Failed - 05/14/2020  9:22 PM      Failed - Cr in normal range and within 180 days    Creat  Date Value Ref Range Status  03/13/2016 0.81 0.50 - 1.10 mg/dL Final   Creatinine, Ser  Date Value Ref Range Status  05/22/2019 0.76 0.57 - 1.00 mg/dL Final         Failed - K in normal range and within 180 days    Potassium  Date Value Ref Range Status  05/22/2019 3.7 3.5 - 5.2 mmol/L Final  05/22/2011 3.2 (L) 3.5 - 5.1 mmol/L Final    Comment:    POTASSIUM - Slight hemolysis, interpret results with  - caution.          Failed - Valid encounter within last 6 months    Recent Outpatient Visits          6 months ago Morbid obesity with BMI of 40.0-44.9, adult [facility])   [place], DO   11 months ago Annual physical exam   [place], DO   1 year ago Acute pain of left shoulder   [facility] [place], Devonne Doughty, DO   2 years ago Morbid obesity with BMI of 40.0-44.9, adult [facility])   [place], DO   2 years ago Annual physical exam   [facility] Olin Hauser, DO             Passed - Patient is not pregnant      Passed - Last BP in normal range    BP Readings from Last 1 Encounters:  11/03/19 117/83

## 2020-05-20 ENCOUNTER — Other Ambulatory Visit: Payer: Self-pay | Admitting: Family Medicine

## 2020-05-20 DIAGNOSIS — K219 Gastro-esophageal reflux disease without esophagitis: Secondary | ICD-10-CM

## 2020-05-21 NOTE — Telephone Encounter (Signed)
Requested Prescriptions  Pending Prescriptions Disp Refills  . omeprazole (PRILOSEC) 40 MG capsule [Pharmacy Med Name: Omeprazole 40 MG Oral Capsule Delayed Release] 30 capsule 0    Sig: TAKE 1 CAPSULE BY MOUTH  DAILY     Gastroenterology: Proton Pump Inhibitors Passed - 05/20/2020  9:20 PM      Passed - Valid encounter within last 12 months    Recent Outpatient Visits          6 months ago Morbid obesity with BMI of 40.0-44.9, adult [facility])   [place], DO   12 months ago Annual physical exam   [place], DO   1 year ago Acute pain of left shoulder   [place], DO   2 years ago Morbid obesity with BMI of 40.0-44.9, adult [facility])   [facility] Olin Hauser, DO   2 years ago Annual physical exam   [place], Devonne Doughty, DO

## 2020-05-25 ENCOUNTER — Other Ambulatory Visit: Payer: Self-pay | Admitting: Unknown Physician Specialty

## 2020-05-25 DIAGNOSIS — E039 Hypothyroidism, unspecified: Secondary | ICD-10-CM

## 2020-05-25 NOTE — Telephone Encounter (Signed)
Courtesy refill. Patient needs to be seen for lab work. Requested Prescriptions  Pending Prescriptions Disp Refills  . levothyroxine (SYNTHROID) 137 MCG tablet [Pharmacy Med Name: MYLAN-LEVOTHYROX 137MCG TABLET] 30 tablet 0    Sig: TAKE 1 TABLET BY MOUTH  DAILY BEFORE BREAKFAST     Endocrinology:  Hypothyroid Agents Failed - 05/25/2020  9:27 PM      Failed - TSH needs to be rechecked within 3 months after an abnormal result. Refill until TSH is due.      Failed - TSH in normal range and within 360 days    TSH  Date Value Ref Range Status  02/19/2020 4.52 (H) mIU/L Final    Comment:              Reference Range .           > or = 20 Years  0.40-4.50 .                Pregnancy Ranges           First trimester    0.26-2.66           Second trimester   0.55-2.73           Third trimester    0.43-2.91          Passed - Valid encounter within last 12 months    Recent Outpatient Visits          6 months ago Morbid obesity with BMI of 40.0-44.9, adult [facility])   [place], DO   1 year ago Annual physical exam   [place], DO   1 year ago Acute pain of left shoulder   [place], Devonne Doughty, DO   2 years ago Morbid obesity with BMI of 40.0-44.9, adult [facility])   [place], DO   2 years ago Annual physical exam   [facility] Olin Hauser, DO

## 2020-06-10 ENCOUNTER — Other Ambulatory Visit: Payer: Self-pay | Admitting: Family Medicine

## 2020-06-10 DIAGNOSIS — I10 Essential (primary) hypertension: Secondary | ICD-10-CM

## 2020-06-10 NOTE — Telephone Encounter (Signed)
Requested medications are due for refill today yes (mail order)  Requested medications are on the active medication list yes  Last refill 5/13 (mail order)  Last visit 10/2019  Future visit scheduled no  Notes to clinic Failed protocol due to no valid visit within 6  months, no upcoming visit scheduled.

## 2020-07-03 ENCOUNTER — Other Ambulatory Visit: Payer: Self-pay | Admitting: Family Medicine

## 2020-07-03 DIAGNOSIS — K219 Gastro-esophageal reflux disease without esophagitis: Secondary | ICD-10-CM

## 2020-07-04 NOTE — Telephone Encounter (Signed)
Requested medication (s) are due for refill today: no  Requested medication (s) are on the active medication list: yes   Last refill:  06/19/2020  Future visit scheduled: no  Notes to clinic: Patient due for follow up    Requested Prescriptions  Pending Prescriptions Disp Refills   omeprazole (PRILOSEC) 40 MG capsule [Pharmacy Med Name: Omeprazole 40 MG Oral Capsule Delayed Release] 30 capsule 11    Sig: TAKE 1 CAPSULE BY MOUTH  DAILY      Gastroenterology: Proton Pump Inhibitors Passed - 07/03/2020  9:16 PM      Passed - Valid encounter within last 12 months    Recent Outpatient Visits           8 months ago Morbid obesity with BMI of 40.0-44.9, adult [facility])   [place], DO   1 year ago Annual physical exam   [place], DO   1 year ago Acute pain of left shoulder   [place], Devonne Doughty, DO   2 years ago Morbid obesity with BMI of 40.0-44.9, adult [facility])   [place], DO   3 years ago Annual physical exam   [place], Devonne Doughty, DO

## 2020-07-08 ENCOUNTER — Other Ambulatory Visit: Payer: Self-pay | Admitting: Family Medicine

## 2020-07-08 DIAGNOSIS — E039 Hypothyroidism, unspecified: Secondary | ICD-10-CM

## 2020-08-03 ENCOUNTER — Other Ambulatory Visit: Payer: Self-pay | Admitting: Family Medicine

## 2020-08-03 DIAGNOSIS — I10 Essential (primary) hypertension: Secondary | ICD-10-CM

## 2020-08-03 NOTE — Telephone Encounter (Signed)
Requested medications are due for refill today.  yes  Requested medications are on the active medications list.  yes  Last refill. 05/16/2020  Future visit scheduled.   no  Notes to clinic.  Pt is more than 3 months overdue for OV.

## 2020-08-18 ENCOUNTER — Other Ambulatory Visit: Payer: Self-pay | Admitting: Family Medicine

## 2020-08-18 DIAGNOSIS — I10 Essential (primary) hypertension: Secondary | ICD-10-CM

## 2020-08-19 NOTE — Telephone Encounter (Signed)
Requested medications are due for refill today yes (mail order)  Requested medications are on the active medication list yes  Last refill 6/10 (mail order)  Last visit 10/2019  Future visit scheduled no  Notes to clinic Has already had a curtesy refill and there is no upcoming appointment scheduled.

## 2020-09-15 ENCOUNTER — Other Ambulatory Visit: Payer: Self-pay | Admitting: Family Medicine

## 2020-09-15 DIAGNOSIS — E039 Hypothyroidism, unspecified: Secondary | ICD-10-CM

## 2020-09-16 NOTE — Telephone Encounter (Signed)
Requested medications are due for refill today yes  Requested medications are on the active medication list yes  Last refill 07/22/20 (mail order)  Last visit 10/2019, last lab 02/19/20  Future visit scheduled no  Notes to clinic Was asked in Oct to return in 6 months but has not, no upcoming appt.

## 2020-10-25 ENCOUNTER — Other Ambulatory Visit: Payer: Self-pay | Admitting: Family Medicine

## 2020-10-25 DIAGNOSIS — I10 Essential (primary) hypertension: Secondary | ICD-10-CM

## 2020-10-25 NOTE — Telephone Encounter (Signed)
Requested medications are due for refill today.  yes  Requested medications are on the active medications list.  yes  Last refill. 08/04/2020  Future visit scheduled.   no  Notes to clinic.  Labs are expired. Pt is more than 3 months overdue for office visit.

## 2020-11-10 ENCOUNTER — Other Ambulatory Visit: Payer: Self-pay | Admitting: Family Medicine

## 2020-11-10 DIAGNOSIS — E039 Hypothyroidism, unspecified: Secondary | ICD-10-CM

## 2020-11-10 MED ORDER — LEVOTHYROXINE SODIUM 137 MCG PO TABS: 137.0000 ug | ORAL_TABLET | Freq: Every day | ORAL | 0 refills | Status: DC

## 2020-11-10 NOTE — Telephone Encounter (Signed)
Courtesy refill given until appointment.

## 2020-11-10 NOTE — Telephone Encounter (Signed)
Medication Refill - Medication: levothyroxine (SYNTHROID) 137 MCG tablet (patient states she has been out since Saturday) patient requesting a short supply to hold her over until 01/11/2021   Has the patient contacted their pharmacy? Yes.    (Agent: If yes, when and what did the pharmacy advise?) Contact PCP office, patient needs appointment  Preferred Pharmacy (with phone number or street name):   [facility] [place] - [address] [place] Phone:  [phone]  Fax:  [phone]      Has the patient been seen for an appointment in the last year OR does the patient have an upcoming appointment? Yes.    Agent: Please be advised that RX refills may take up to 3 business days. We ask that you follow-up with your pharmacy.
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
## 2021-01-11 ENCOUNTER — Encounter: Payer: Self-pay | Admitting: Family Medicine

## 2021-01-11 ENCOUNTER — Other Ambulatory Visit: Payer: Self-pay | Admitting: Family Medicine

## 2021-01-11 ENCOUNTER — Ambulatory Visit (INDEPENDENT_AMBULATORY_CARE_PROVIDER_SITE_OTHER): Payer: Managed Care, Other (non HMO) | Admitting: Family Medicine

## 2021-01-11 ENCOUNTER — Other Ambulatory Visit: Payer: Self-pay

## 2021-01-11 VITALS — BP 124/68 | HR 80 | Ht 68.0 in | Wt 279.0 lb

## 2021-01-11 DIAGNOSIS — Z720 Tobacco use: Secondary | ICD-10-CM

## 2021-01-11 DIAGNOSIS — E039 Hypothyroidism, unspecified: Secondary | ICD-10-CM

## 2021-01-11 DIAGNOSIS — I10 Essential (primary) hypertension: Secondary | ICD-10-CM

## 2021-01-11 DIAGNOSIS — Z0000 Encounter for general adult medical examination without abnormal findings: Secondary | ICD-10-CM

## 2021-01-11 DIAGNOSIS — R7309 Other abnormal glucose: Secondary | ICD-10-CM

## 2021-01-11 DIAGNOSIS — Z1159 Encounter for screening for other viral diseases: Secondary | ICD-10-CM

## 2021-01-11 DIAGNOSIS — E782 Mixed hyperlipidemia: Secondary | ICD-10-CM | POA: Diagnosis not present

## 2021-01-11 DIAGNOSIS — E042 Nontoxic multinodular goiter: Secondary | ICD-10-CM

## 2021-01-11 DIAGNOSIS — Z1231 Encounter for screening mammogram for malignant neoplasm of breast: Secondary | ICD-10-CM

## 2021-01-11 DIAGNOSIS — Z1211 Encounter for screening for malignant neoplasm of colon: Secondary | ICD-10-CM

## 2021-01-11 DIAGNOSIS — Z6841 Body Mass Index (BMI) 40.0 and over, adult: Secondary | ICD-10-CM

## 2021-01-11 MED ORDER — LOSARTAN POTASSIUM 100 MG PO TABS: 100.0000 mg | ORAL_TABLET | Freq: Every day | ORAL | 3 refills | Status: DC

## 2021-01-11 MED ORDER — HYDROCHLOROTHIAZIDE 25 MG PO TABS: 25.0000 mg | ORAL_TABLET | Freq: Every day | ORAL | 3 refills | Status: DC

## 2021-01-11 MED ORDER — LEVOTHYROXINE SODIUM 137 MCG PO TABS: 137.0000 ug | ORAL_TABLET | Freq: Every day | ORAL | 0 refills | Status: DC

## 2021-01-11 MED ORDER — LEVOTHYROXINE SODIUM 137 MCG PO TABS: 137.0000 ug | ORAL_TABLET | Freq: Every day | ORAL | 3 refills | Status: DC

## 2021-01-11 NOTE — Progress Notes (Signed)
Subjective:    Patient ID: Ashley Clarke, female    DOB: 10-06-1975, 45 y.o.   MRN: [id number]  Ashley Clarke is a 45 y.o. female presenting on 01/11/2021 for Annual Exam   HPI  Here for Annual Physical and Lab Review.  Morbid Obesity BMI >42 Down 13 lb in 1 year Working on lifestyle diet exercise Last A1c 5.7 due for lab   Adjustment disorder mood Mother passed away Feb 27, 2019, she has been trying to self manage and cope. She has had some down mood at times but declines depression.      FOLLOW-UP Hypothyroidism / Multinodular Goiter Followed by [facility] Dr Tami Ribas, had recent Thyroid US and showed goiter nodules are improving or getting smaller. Last visit 05/2019 annual had TSH up to 12 and dose Levothyroxine was increased from 125 up to 137 mcg daily. She can feel some slight improvement. Now due for thyroid panel.     Tobacco Abuse Still active smoker, she is down to weaning cigarettes down to 5-6 per day, she still has problem with stressful parts of day morning. Has been successful in reducing, and wants to decline NRT or medication at this time. goal to continue cut back to cold [place] quit within 3 months, goals to quit smoking and lose weight by son's wedding April 2022    Health Maintenance:  No prior colon CA screening. Age 31+ due for screening. No fam history.  Last pap 2016, [facility], she will re schedule.  Mammogram is overdue, no prior screening.  Depression screen [facility] 2/9 01/11/2021 11/03/2019 05/25/2019  Decreased Interest 2 0 1  Down, Depressed, Hopeless 1 0 1  PHQ - 2 Score 3 0 2  Altered sleeping 2 - 2  Tired, decreased energy 0 - 1  Change in appetite 0 - 2  Feeling bad or failure about yourself  0 - 1  Trouble concentrating 0 - 1  Moving slowly or fidgety/restless 0 - 1  Suicidal thoughts 0 - 0  PHQ-9 Score 5 - 10  Difficult doing work/chores Not difficult at all - Somewhat difficult    Past Medical History:   Diagnosis Date   Hyperlipidemia    Hypertension    Hypothyroidism    PONV (postoperative nausea and vomiting)    Past Surgical History:  Procedure Laterality Date   APPENDECTOMY     DG GALL BLADDER     KNEE SURGERY     SUPRACERVICAL ABDOMINAL HYSTERECTOMY  2014   reported by patient, do not have op report   TONSILLECTOMY     Social History   Socioeconomic History   Marital status: Divorced    Spouse name: Not on file   Number of children: Not on file   Years of education: Not on file   Highest education level: Not on file  Occupational History   Occupation: [employer] Employee  Tobacco Use   Smoking status: Every Day    Packs/day: 0.25    Years: 30.00    Pack years: 7.50    Types: Cigarettes   Smokeless tobacco: Never   Tobacco comments:    < 0.5ppd avg smoking  Vaping Use   Vaping Use: Never used  Substance and Sexual Activity   Alcohol use: No    Alcohol/week: 0.0 standard drinks   Drug use: No   Sexual activity: Not on file  Other Topics Concern   Not on file  Social History Narrative   Not on file  Social Determinants of Health   Financial Resource Strain: Not on file  Food Insecurity: Not on file  Transportation Needs: Not on file  Physical Activity: Not on file  Stress: Not on file  Social Connections: Not on file  Intimate Partner Violence: Not on file   Family History  Problem Relation Age of Onset   Stroke Paternal Aunt    Breast cancer Paternal Aunt 66   Stroke Maternal Grandmother    Colon cancer Neg Hx    Current Outpatient Medications on File Prior to Visit  Medication Sig   omeprazole (PRILOSEC) 40 MG capsule TAKE 1 CAPSULE BY MOUTH  DAILY   No current facility-administered medications on file prior to visit.    Review of Systems  Constitutional:  Negative for activity change, appetite change, chills, diaphoresis, fatigue and fever.  HENT:  Negative for congestion and hearing loss.   Eyes:  Negative for visual disturbance.   Respiratory:  Negative for cough, chest tightness, shortness of breath and wheezing.   Cardiovascular:  Negative for chest pain, palpitations and leg swelling.  Gastrointestinal:  Negative for abdominal pain, constipation, diarrhea, nausea and vomiting.  Genitourinary:  Negative for dysuria, frequency and hematuria.  Musculoskeletal:  Negative for arthralgias and neck pain.  Skin:  Negative for rash.  Neurological:  Negative for dizziness, weakness, light-headedness, numbness and headaches.  Hematological:  Negative for adenopathy.  Psychiatric/Behavioral:  Negative for behavioral problems, dysphoric mood and sleep disturbance.   Per HPI unless specifically indicated above     Objective:    BP 124/68    Pulse 80    Ht 5\' 8"  (1.727 m)    Wt 279 lb (126.6 kg)    SpO2 100%    BMI 42.42 kg/m   Wt Readings from Last 3 Encounters:  01/11/21 279 lb (126.6 kg)  11/03/19 292 lb 9.6 oz (132.7 kg)  05/25/19 291 lb 9.6 oz (132.3 kg)    Physical Exam Vitals and nursing note reviewed.  Constitutional:      General: She is not in acute distress.    Appearance: She is well-developed. She is not diaphoretic.     Comments: Well-appearing, comfortable, cooperative  HENT:     Head: Normocephalic and atraumatic.  Eyes:     General:        Right eye: No discharge.        Left eye: No discharge.     Conjunctiva/sclera: Conjunctivae normal.     Pupils: Pupils are equal, round, and reactive to light.  Neck:     Thyroid: No thyromegaly.  Cardiovascular:     Rate and Rhythm: Normal rate and regular rhythm.     Pulses: Normal pulses.     Heart sounds: Normal heart sounds. No murmur heard. Pulmonary:     Effort: Pulmonary effort is normal. No respiratory distress.     Breath sounds: Normal breath sounds. No wheezing or rales.  Abdominal:     General: Bowel sounds are normal. There is no distension.     Palpations: Abdomen is soft. There is no mass.     Tenderness: There is no abdominal  tenderness.  Musculoskeletal:        General: No tenderness. Normal range of motion.     Cervical back: Normal range of motion and neck supple.     Comments: Upper / Lower Extremities: - Normal muscle tone, strength bilateral upper extremities 5/5, lower extremities 5/5  Lymphadenopathy:     Cervical: No cervical adenopathy.  Skin:  General: Skin is warm and dry.     Findings: No erythema or rash.  Neurological:     Mental Status: She is alert and oriented to person, place, and time.     Comments: Distal sensation intact to light touch all extremities  Psychiatric:        Mood and Affect: Mood normal.        Behavior: Behavior normal.        Thought Content: Thought content normal.     Comments: Well groomed, good eye contact, normal speech and thoughts   Results for orders placed or performed in visit on 02/18/20  TSH  Result Value Ref Range   TSH 4.52 (H) mIU/L  T4  Result Value Ref Range   T4, Total 10.3 5.1 - 11.9 mcg/dL    Current Outpatient Medications:    omeprazole (PRILOSEC) 40 MG capsule, TAKE 1 CAPSULE BY MOUTH  DAILY, Disp: 30 capsule, Rfl: 11   hydrochlorothiazide (HYDRODIURIL) 25 MG tablet, Take 1 tablet (25 mg total) by mouth daily., Disp: 90 tablet, Rfl: 3   levothyroxine (SYNTHROID) 137 MCG tablet, Take 1 tablet (137 mcg total) by mouth daily before breakfast., Disp: 90 tablet, Rfl: 3   losartan (COZAAR) 100 MG tablet, Take 1 tablet (100 mg total) by mouth daily., Disp: 90 tablet, Rfl: 3     Assessment & Plan:   Problem List Items Addressed This Visit     Tobacco abuse   Morbid obesity with BMI of 40.0-44.9, adult (HCC)   Relevant Orders   Lipid panel   Hyperlipidemia   Relevant Medications   hydrochlorothiazide (HYDRODIURIL) 25 MG tablet   losartan (COZAAR) 100 MG tablet   Other Relevant Orders   Lipid panel   Essential (primary) hypertension   Relevant Medications   hydrochlorothiazide (HYDRODIURIL) 25 MG tablet   losartan (COZAAR) 100 MG  tablet   Other Relevant Orders   CBC with Differential/Platelet   Comprehensive metabolic panel   Adult hypothyroidism   Relevant Medications   levothyroxine (SYNTHROID) 137 MCG tablet   Other Relevant Orders   TSH   T4, free   Other Visit Diagnoses     Annual physical exam    -  Primary   Relevant Orders   Lipid panel   Hemoglobin A1c   CBC with Differential/Platelet   Comprehensive metabolic panel   Elevated hemoglobin A1c       Relevant Orders   Hemoglobin A1c   Need for hepatitis C screening test       Relevant Orders   Hepatitis C antibody   Screening for colon cancer       Relevant Orders   Cologuard   Screening mammogram for breast cancer       Relevant Orders   MM 3D SCREEN BREAST BILATERAL       Updated Health Maintenance information Fasting labs ordered for [facility], print out given today Encouraged improvement to lifestyle with diet and exercise Goal of weight loss  HTN Controlled Will re order meds Losartan HCTZ  A1c due for repeat.  Thyroid panel ordered. She will return to [facility] for routine follow-up of enlarged thyroid nodules / goiter as planned. Last visit 2 years ago [place]  Ordered Mammogram [facility] she will call to schedule, initial screening. Has some fam history breast cancer maternal aunt age 45. Asymptomatic.  Due for routine colon cancer screening. Never had colonoscopy (not interested), no family history colon cancer. - Discussion today about recommendations for either Colonoscopy or  Cologuard screening, benefits and risks of screening, interested in Cologuard, understands that if positive then recommendation is for diagnostic colonoscopy to follow-up. - Ordered Cologuard today   Meds ordered this encounter  Medications   DISCONTD: levothyroxine (SYNTHROID) 137 MCG tablet    Sig: Take 1 tablet (137 mcg total) by mouth daily before breakfast.    Dispense:  30 tablet    Refill:  0   hydrochlorothiazide (HYDRODIURIL) 25 MG tablet     Sig: Take 1 tablet (25 mg total) by mouth daily.    Dispense:  90 tablet    Refill:  3   losartan (COZAAR) 100 MG tablet    Sig: Take 1 tablet (100 mg total) by mouth daily.    Dispense:  90 tablet    Refill:  3   levothyroxine (SYNTHROID) 137 MCG tablet    Sig: Take 1 tablet (137 mcg total) by mouth daily before breakfast.    Dispense:  90 tablet    Refill:  3     Follow up plan: Return in about 1 year (around 01/11/2022) for 1 Year Annual Physical ([facility] orders after visit, printed).  Nobie Putnam, [facility] 01/11/2021, 1:51 PM

## 2021-01-11 NOTE — Patient Instructions (Addendum)
Thank you for coming to the office today.  Ordered the Cologuard (home kit) test for colon cancer screening. Stay tuned for further updates.  It will be shipped to you directly. If not received in 2-4 weeks, call us or the company.   If you send it back and no results are received in 2-4 weeks, call us or the company as well!   Colon Cancer Screening: - For all adults age 46+ routine colon cancer screening is highly recommended.     - Recent guidelines from [place] recommend starting age of 78 - Early detection of colon cancer is important, because often there are no warning signs or symptoms, also if found early usually it can be cured. Late stage is hard to treat.   - If Cologuard is NEGATIVE, then it is good for 3 years before next due - If Cologuard is POSITIVE, then it is strongly advised to get a Colonoscopy, which allows the GI doctor to locate the source of the cancer or polyp (even very early stage) and treat it by removing it. ------------------------- Follow instructions to collect sample, you may call the company for any help or questions, 24/7 telephone support at [phone].  -----------------------------------------  For Mammogram screening for breast cancer   Call the [place] below anytime to schedule your own appointment now that order has been placed.  [facility] [place],  [address] Phone: [phone]   Please schedule a Follow-up Appointment to: Return in about 1 year (around 01/11/2022) for 1 Year Annual Physical ([facility] orders after visit, printed).  If you have any other questions or concerns, please feel free to call the office or send a message through [facility]. You may also schedule an earlier appointment if necessary.  Additionally, you may be receiving a survey about your experience at our office within a few days to 1 week by e-mail or mail. We value  your feedback.  Nobie Putnam, DO [place]

## 2021-01-12 NOTE — Telephone Encounter (Signed)
Rx signed 01/11/2021 #90 with 3 refills. Requested Prescriptions  Pending Prescriptions Disp Refills   levothyroxine (SYNTHROID) 137 MCG tablet [Pharmacy Med Name: LEVOTHYROXINE 0.137MG  (137MCG) TAB] 90 tablet 3    Sig: TAKE 1 TABLET(137 MCG) BY MOUTH DAILY BEFORE BREAKFAST     Endocrinology:  Hypothyroid Agents Failed - 01/11/2021  2:21 PM      Failed - TSH needs to be rechecked within 3 months after an abnormal result. Refill until TSH is due.      Failed - TSH in normal range and within 360 days    TSH  Date Value Ref Range Status  02/19/2020 4.52 (H) mIU/L Final    Comment:              Reference Range .           > or = 20 Years  0.40-4.50 .                Pregnancy Ranges           First trimester    0.26-2.66           Second trimester   0.55-2.73           Third trimester    0.43-2.91          Passed - Valid encounter within last 12 months    Recent Outpatient Visits          Yesterday Annual physical exam   [facility] Olin Hauser, DO   1 year ago Morbid obesity with BMI of 40.0-44.9, adult [facility])   [place], DO   1 year ago Annual physical exam   [place], DO   2 years ago Acute pain of left shoulder   [place], DO   3 years ago Morbid obesity with BMI of 40.0-44.9, adult [facility])   [facility], Devonne Doughty, DO

## 2021-01-24 LAB — CBC WITH DIFFERENTIAL/PLATELET
Basophils Absolute: 0.1 10*3/uL (ref 0.0–0.2)
Basos: 1 %
EOS (ABSOLUTE): 0.3 10*3/uL (ref 0.0–0.4)
Eos: 4 %
Hematocrit: 44.2 % (ref 34.0–46.6)
Hemoglobin: 14.7 g/dL (ref 11.1–15.9)
Immature Grans (Abs): 0 10*3/uL (ref 0.0–0.1)
Immature Granulocytes: 1 %
Lymphocytes Absolute: 2.6 10*3/uL (ref 0.7–3.1)
Lymphs: 32 %
MCH: 29.2 pg (ref 26.6–33.0)
MCHC: 33.3 g/dL (ref 31.5–35.7)
MCV: 88 fL (ref 79–97)
Monocytes Absolute: 0.4 10*3/uL (ref 0.1–0.9)
Monocytes: 5 %
Neutrophils Absolute: 4.8 10*3/uL (ref 1.4–7.0)
Neutrophils: 57 %
Platelets: 313 10*3/uL (ref 150–450)
RBC: 5.04 x10E6/uL (ref 3.77–5.28)
RDW: 13.8 % (ref 11.7–15.4)
WBC: 8.2 10*3/uL (ref 3.4–10.8)

## 2021-01-24 LAB — COMPREHENSIVE METABOLIC PANEL
ALT: 34 IU/L — ABNORMAL HIGH (ref 0–32)
AST: 30 IU/L (ref 0–40)
Albumin/Globulin Ratio: 1.3 (ref 1.2–2.2)
Albumin: 4.3 g/dL (ref 3.8–4.8)
Alkaline Phosphatase: 82 IU/L (ref 44–121)
BUN/Creatinine Ratio: 13 (ref 9–23)
BUN: 11 mg/dL (ref 6–24)
Bilirubin Total: 1.1 mg/dL (ref 0.0–1.2)
CO2: 27 mmol/L (ref 20–29)
Calcium: 9.8 mg/dL (ref 8.7–10.2)
Chloride: 97 mmol/L (ref 96–106)
Creatinine, Ser: 0.83 mg/dL (ref 0.57–1.00)
Globulin, Total: 3.2 g/dL (ref 1.5–4.5)
Glucose: 130 mg/dL — ABNORMAL HIGH (ref 70–99)
Potassium: 3.5 mmol/L (ref 3.5–5.2)
Sodium: 140 mmol/L (ref 134–144)
Total Protein: 7.5 g/dL (ref 6.0–8.5)
eGFR: 88 mL/min/{1.73_m2} (ref >59)

## 2021-01-24 LAB — T4, FREE: Free T4: 1.01 ng/dL (ref 0.82–1.77)

## 2021-01-24 LAB — HEPATITIS C ANTIBODY: Hep C Virus Ab: <0.1 s/co ratio (ref 0.0–0.9)

## 2021-01-24 LAB — LIPID PANEL
Chol/HDL Ratio: 6.2 ratio — ABNORMAL HIGH (ref 0.0–4.4)
Cholesterol, Total: 216 mg/dL — ABNORMAL HIGH (ref 100–199)
HDL: 35 mg/dL — ABNORMAL LOW (ref >39)
LDL Chol Calc (NIH): 159 mg/dL — ABNORMAL HIGH (ref 0–99)
Triglycerides: 119 mg/dL (ref 0–149)
VLDL Cholesterol Cal: 22 mg/dL (ref 5–40)

## 2021-01-24 LAB — HEMOGLOBIN A1C
Est. average glucose Bld gHb Est-mCnc: 128 mg/dL
Hgb A1c MFr Bld: 6.1 % — ABNORMAL HIGH (ref 4.8–5.6)

## 2021-01-24 LAB — TSH: TSH: 12 u[IU]/mL — ABNORMAL HIGH (ref 0.450–4.500)

## 2021-02-07 ENCOUNTER — Other Ambulatory Visit: Payer: Self-pay | Admitting: Unknown Physician Specialty

## 2021-02-07 ENCOUNTER — Encounter: Payer: Self-pay | Admitting: Family Medicine

## 2021-02-07 DIAGNOSIS — E049 Nontoxic goiter, unspecified: Secondary | ICD-10-CM

## 2021-02-08 ENCOUNTER — Ambulatory Visit
Admission: RE | Admit: 2021-02-08 | Discharge: 2021-02-08 | Disposition: A | Discharge status: Home / Self Care | Payer: Managed Care, Other (non HMO) | Source: Ambulatory Visit | Attending: Unknown Physician Specialty | Admitting: Unknown Physician Specialty

## 2021-02-08 ENCOUNTER — Other Ambulatory Visit: Payer: Self-pay

## 2021-02-08 DIAGNOSIS — E049 Nontoxic goiter, unspecified: Secondary | ICD-10-CM

## 2021-02-10 ENCOUNTER — Other Ambulatory Visit: Payer: Self-pay | Admitting: Unknown Physician Specialty

## 2021-02-10 DIAGNOSIS — E049 Nontoxic goiter, unspecified: Secondary | ICD-10-CM

## 2021-02-13 ENCOUNTER — Other Ambulatory Visit: Payer: Self-pay | Admitting: Unknown Physician Specialty

## 2021-02-15 LAB — SURGICAL PATHOLOGY

## 2021-05-23 ENCOUNTER — Other Ambulatory Visit: Payer: Self-pay | Admitting: Family Medicine

## 2021-05-23 DIAGNOSIS — K219 Gastro-esophageal reflux disease without esophagitis: Secondary | ICD-10-CM

## 2021-05-24 NOTE — Telephone Encounter (Signed)
Requested Prescriptions  ?Pending Prescriptions Disp Refills  ?? omeprazole (PRILOSEC) 40 MG capsule [Pharmacy Med Name: Omeprazole 40 MG Oral Capsule Delayed Release] 90 capsule 2  ?  Sig: TAKE 1 CAPSULE BY MOUTH  DAILY  ?  ? Gastroenterology: Proton Pump Inhibitors Passed - 05/23/2021 11:21 PM  ?  ?  Passed - Valid encounter within last 12 months  ?  Recent Outpatient Visits   ?      ? 4 months ago Annual physical exam  ? [place], DO  ? 1 year ago Morbid obesity with BMI of 40.0-44.9, adult [facility])  ? [place], DO  ? 2 years ago Annual physical exam  ? [place], DO  ? 2 years ago Acute pain of left shoulder  ? [place], DO  ? 3 years ago Morbid obesity with BMI of 40.0-44.9, adult [facility])  ? [place], DO  ?  ?  ? ?  ?  ?  ? ? ?

## 2021-10-09 IMAGING — US US THYROID
1 series · 15 of 25 positions shown · non-contrast
Comparison: 09/17/2017;
COMPARISON: 09/17/2017;

Addendum:
CLINICAL DATA: Prior ultrasound follow-up. Follow-up multinodular
goiter. History of ultrasound-guided fine-needle aspiration of right
superior and inferior thyroid nodules performed 02/13/2016.

EXAM:
THYROID ULTRASOUND
TECHNIQUE: Ultrasound examination of the thyroid gland and adjacent soft
tissues was performed.

[Series 1: us thyroid · 0.08mm/px · 15 of 58 slices shown]
[im 1/58]
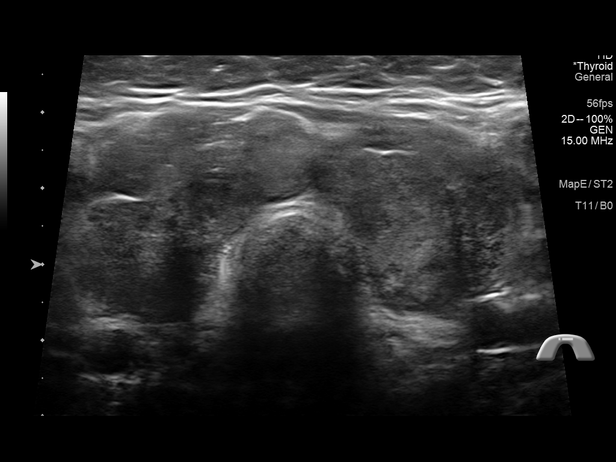
[im 5/58]
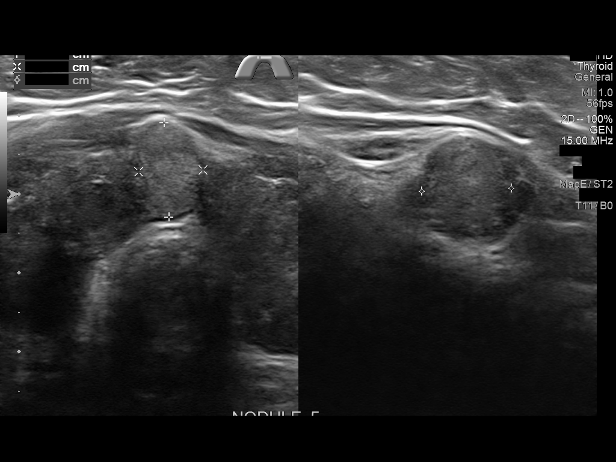
[im 10/58]
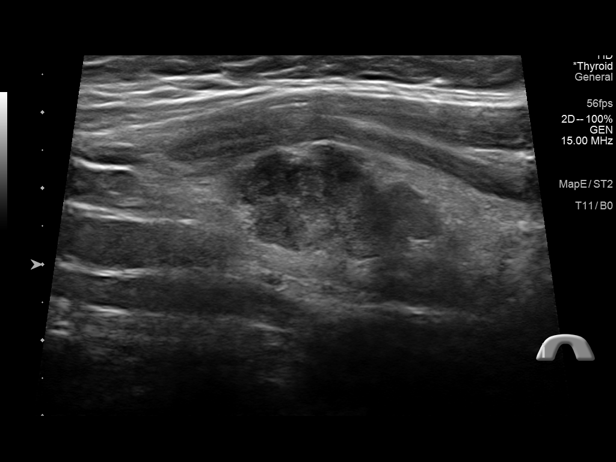
[im 12/58]
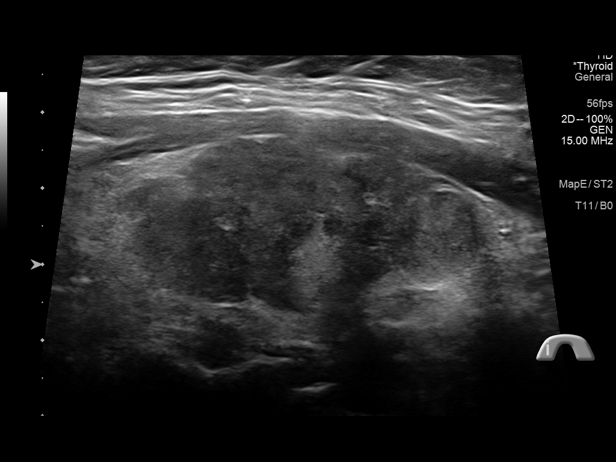
[im 17/58]
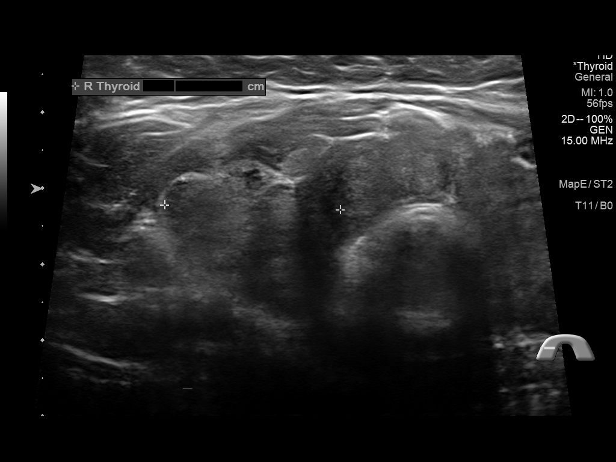
[im 22/58]
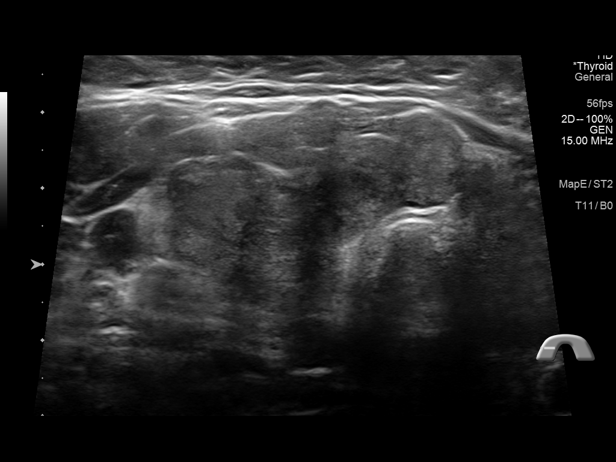
[im 24/58]
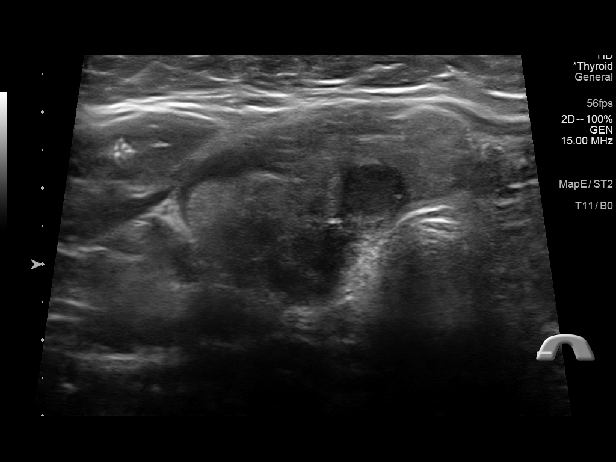
[im 29/58]
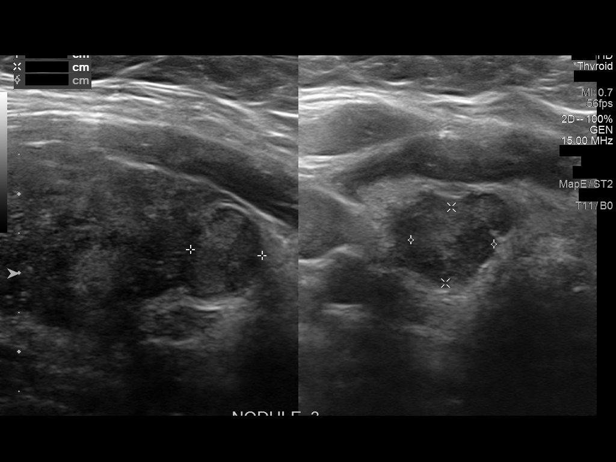
[im 34/58]
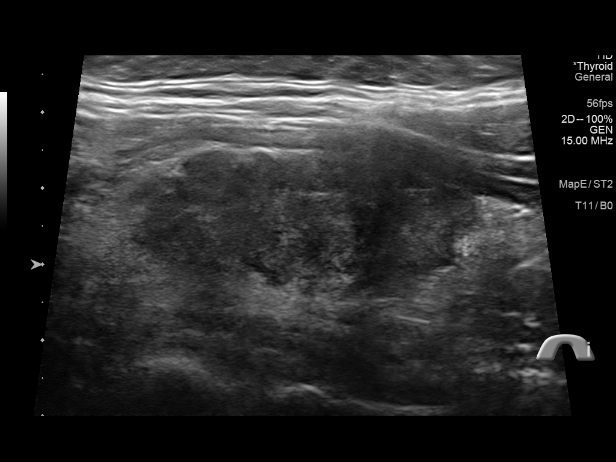
[im 36/58]
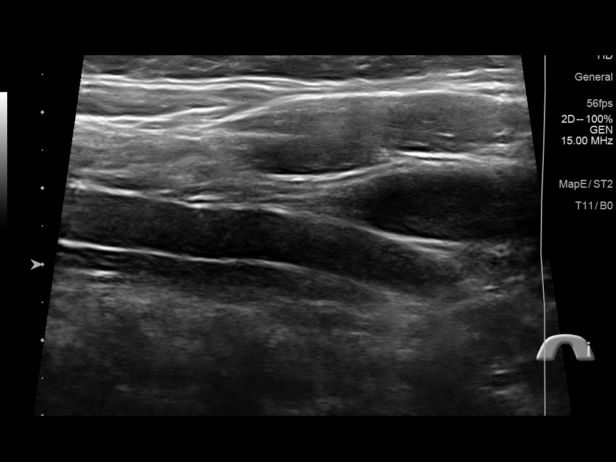
[im 41/58]
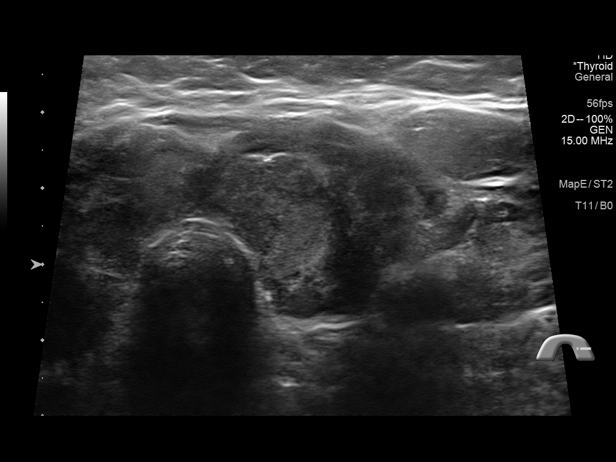
[im 46/58]
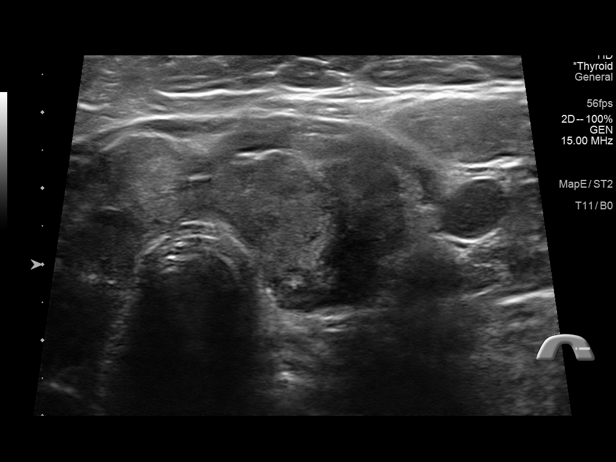
[im 48/58]
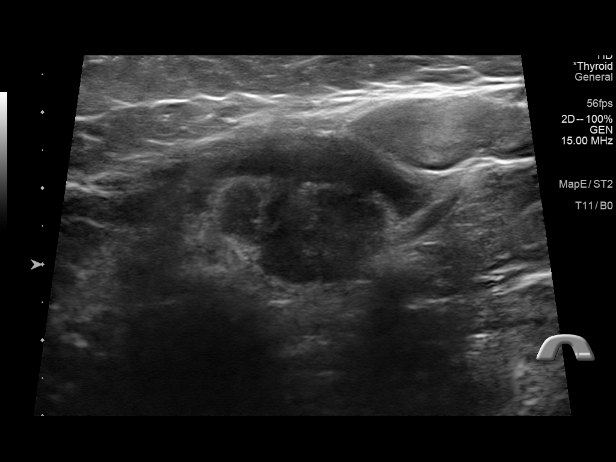
[im 53/58]
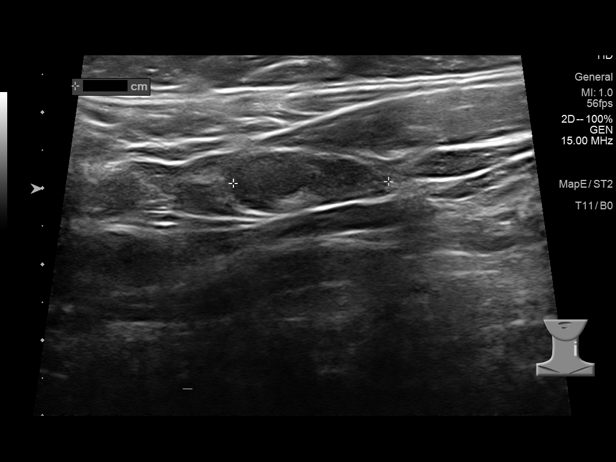
[im 58/58]
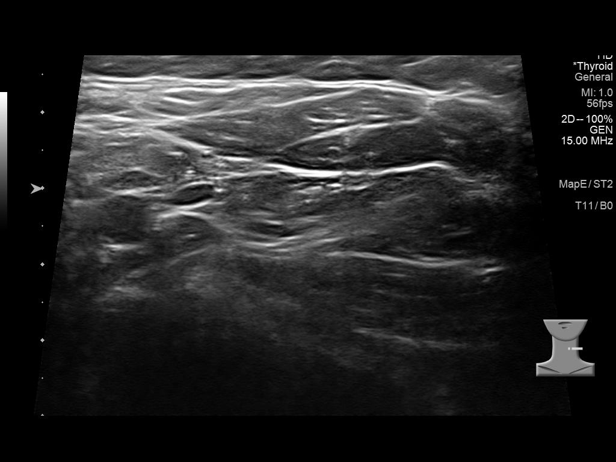

[15 of 25 positions shown; findings below may reference images not displayed]

02/20/2018; 01/30/2016; 08/19/2014;
02/18/2014; 03/18/2013; ultrasound-guided right superior inferior
thyroid nodule fine-needle aspiration performed 02/13/2016.
FINDINGS: Parenchymal Echotexture: Markedly heterogenous

Isthmus: Enlarged measure 1.2 cm in diameter, unchanged

Right lobe: Normal in size measuring 4.8 x 2.2 x 2.3 cm, previously,
4.8 x 2.1 x 2.1 cm

Left lobe: Normal in size measuring 4.7 x 1.9 x 2.5 cm, previously,
4.7 x 1.8 x 2.1 cm

_________________________________________________________

Estimated total number of nodules >/= 1 cm: 4

Number of spongiform nodules >/=  2 cm not described below (TR1): 0

Number of mixed cystic and solid nodules >/= 1.5 cm not described
below (TR2): 0

_________________________________________________________

Questioned approximately 1.2 cm isoechoic ill-defined
nodule/pseudonodule within the thyroid isthmus is unchanged in hind
site compared to the [DATE] examination and again does not meet
criteria to recommend percutaneous sampling or continued dedicated
follow-up

Both previously biopsied nodules within the right lobe of the
thyroid have significantly decreased in size compared to the [DATE]
examination, previously, 2.7 and 1.7 cm in diameter, currently,
and 0.8 cm in diameter respectively (presently labeled nodules 1 and
3).

The approximately 1.0 cm hypoechoic ill-defined isoechoic
nodule/pseudonodule within the mid aspect the right lobe of the
thyroid (labeled 2) is unchanged compared to the [DATE] examination
and again does not meet criteria to recommend percutaneous sampling
or continued dedicated follow-up

_________________________________________________________

Questioned approximately 0.8 cm isoechoic ill-defined nodule within
the mid aspect the left lobe of the thyroid (labeled 6), is favored
to represent a pseudonodule as it lacks defined borders on both
provided transverse and longitudinal images.
IMPRESSION: 1. Similar appearing markedly heterogeneous thyroid without
definitive worrisome new or enlarging thyroid nodule.
2. Previously biopsied right-sided thyroid nodules have
significantly decreased in size compared to the 9914 examination.
Correlation previous biopsy results is advised. Assuming a benign
pathologic diagnosis, repeat sampling and/or continued dedicated
follow-up is not recommended.
3. None of the discretely measured thyroid nodules/pseudo nodules
meet imaging criteria to recommend percutaneous sampling or
continued dedicated follow-up.

The above is in keeping with the ACR TI-RADS recommendations - [HOSPITAL] 6452;[DATE].

ADDENDUM:
Scattered left-sided cervical lymph nodes are prominent though not
enlarged by size criteria with index left-sided cervical lymph node
measuring 0.7 cm in greatest short axis diameter (image 54),
unchanged to slightly decreased in size compared to the [DATE]
examination, previously, 0.8 cm, and now contains a benign fatty
hilum without evidence of previously questioned central necrosis.

*** End of Addendum ***
02/20/2018; 01/30/2016; 08/19/2014;
02/18/2014; 03/18/2013; ultrasound-guided right superior inferior
thyroid nodule fine-needle aspiration performed 02/13/2016.
FINDINGS: Parenchymal Echotexture: Markedly heterogenous

Isthmus: Enlarged measure 1.2 cm in diameter, unchanged

Right lobe: Normal in size measuring 4.8 x 2.2 x 2.3 cm, previously,
4.8 x 2.1 x 2.1 cm

Left lobe: Normal in size measuring 4.7 x 1.9 x 2.5 cm, previously,
4.7 x 1.8 x 2.1 cm

_________________________________________________________

Estimated total number of nodules >/= 1 cm: 4

Number of spongiform nodules >/=  2 cm not described below (TR1): 0

Number of mixed cystic and solid nodules >/= 1.5 cm not described
below (TR2): 0

_________________________________________________________

Questioned approximately 1.2 cm isoechoic ill-defined
nodule/pseudonodule within the thyroid isthmus is unchanged in hind
site compared to the [DATE] examination and again does not meet
criteria to recommend percutaneous sampling or continued dedicated
follow-up

Both previously biopsied nodules within the right lobe of the
thyroid have significantly decreased in size compared to the [DATE]
examination, previously, 2.7 and 1.7 cm in diameter, currently,
and 0.8 cm in diameter respectively (presently labeled nodules 1 and
3).

The approximately 1.0 cm hypoechoic ill-defined isoechoic
nodule/pseudonodule within the mid aspect the right lobe of the
thyroid (labeled 2) is unchanged compared to the [DATE] examination
and again does not meet criteria to recommend percutaneous sampling
or continued dedicated follow-up

_________________________________________________________

Questioned approximately 0.8 cm isoechoic ill-defined nodule within
the mid aspect the left lobe of the thyroid (labeled 6), is favored
to represent a pseudonodule as it lacks defined borders on both
provided transverse and longitudinal images.
IMPRESSION: 1. Similar appearing markedly heterogeneous thyroid without
definitive worrisome new or enlarging thyroid nodule.
2. Previously biopsied right-sided thyroid nodules have
significantly decreased in size compared to the 9914 examination.
Correlation previous biopsy results is advised. Assuming a benign
pathologic diagnosis, repeat sampling and/or continued dedicated
follow-up is not recommended.
3. None of the discretely measured thyroid nodules/pseudo nodules
meet imaging criteria to recommend percutaneous sampling or
continued dedicated follow-up.

The above is in keeping with the ACR TI-RADS recommendations - [HOSPITAL] 6452;[DATE].

## 2021-11-11 ENCOUNTER — Other Ambulatory Visit: Payer: Self-pay | Admitting: Family Medicine

## 2021-11-11 DIAGNOSIS — I10 Essential (primary) hypertension: Secondary | ICD-10-CM

## 2021-11-13 NOTE — Telephone Encounter (Signed)
Refilled 01/11/2021 #90 3 rf. Requested Prescriptions  Pending Prescriptions Disp Refills  . hydrochlorothiazide (HYDRODIURIL) 25 MG tablet [Pharmacy Med Name: hydroCHLOROthiazide 25 MG Oral Tablet] 90 tablet 3    Sig: TAKE 1 TABLET BY MOUTH DAILY     Cardiovascular: Diuretics - Thiazide Failed - 11/11/2021 10:06 PM      Failed - Cr in normal range and within 180 days    Creat  Date Value Ref Range Status  03/13/2016 0.81 0.50 - 1.10 mg/dL Final   Creatinine, Ser  Date Value Ref Range Status  01/23/2021 0.83 0.57 - 1.00 mg/dL Final         Failed - K in normal range and within 180 days    Potassium  Date Value Ref Range Status  01/23/2021 3.5 3.5 - 5.2 mmol/L Final  05/22/2011 3.2 (L) 3.5 - 5.1 mmol/L Final    Comment:    POTASSIUM - Slight hemolysis, interpret results with  - caution.          Failed - Na in normal range and within 180 days    Sodium  Date Value Ref Range Status  01/23/2021 140 134 - 144 mmol/L Final         Failed - Valid encounter within last 6 months    Recent Outpatient Visits          10 months ago Annual physical exam   [facility] Olin Hauser, DO   2 years ago Morbid obesity with BMI of 40.0-44.9, adult [facility])   [place], DO   2 years ago Annual physical exam   [place], DO   3 years ago Acute pain of left shoulder   [facility] [place], Devonne Doughty, DO   4 years ago Morbid obesity with BMI of 40.0-44.9, adult [facility])   [place], DO             Passed - Last BP in normal range    BP Readings from Last 1 Encounters:  01/11/21 124/68

## 2021-12-07 ENCOUNTER — Other Ambulatory Visit: Payer: Self-pay | Admitting: Family Medicine

## 2021-12-07 DIAGNOSIS — E039 Hypothyroidism, unspecified: Secondary | ICD-10-CM

## 2021-12-08 NOTE — Telephone Encounter (Signed)
Rx 01/11/21 #90 3RF- too soon Requested Prescriptions  Pending Prescriptions Disp Refills   levothyroxine (SYNTHROID) 137 MCG tablet [Pharmacy Med Name: MYLAN-LEVOTHYROX 137MCG TABLET] 90 tablet 3    Sig: TAKE 1 TABLET BY MOUTH DAILY  BEFORE BREAKFAST     Endocrinology:  Hypothyroid Agents Failed - 12/07/2021 10:29 PM      Failed - TSH in normal range and within 360 days    TSH  Date Value Ref Range Status  01/23/2021 12.000 (H) 0.450 - 4.500 uIU/mL Final         Passed - Valid encounter within last 12 months    Recent Outpatient Visits           11 months ago Annual physical exam   [facility] Olin Hauser, DO   2 years ago Morbid obesity with BMI of 40.0-44.9, adult [facility])   [place], DO   2 years ago Annual physical exam   [place], DO   3 years ago Acute pain of left shoulder   [facility] [place], Devonne Doughty, DO   4 years ago Morbid obesity with BMI of 40.0-44.9, adult [facility])   [facility] [place], Devonne Doughty, DO

## 2021-12-21 ENCOUNTER — Other Ambulatory Visit: Payer: Self-pay | Admitting: Family Medicine

## 2021-12-21 DIAGNOSIS — I10 Essential (primary) hypertension: Secondary | ICD-10-CM

## 2021-12-21 NOTE — Telephone Encounter (Signed)
Requested Prescriptions  Pending Prescriptions Disp Refills   losartan (COZAAR) 100 MG tablet [Pharmacy Med Name: Losartan Potassium 100 MG Oral Tablet] 90 tablet 0    Sig: TAKE 1 TABLET BY MOUTH DAILY     Cardiovascular:  Angiotensin Receptor Blockers Failed - 12/21/2021  6:50 AM      Failed - Cr in normal range and within 180 days    Creat  Date Value Ref Range Status  03/13/2016 0.81 0.50 - 1.10 mg/dL Final   Creatinine, Ser  Date Value Ref Range Status  01/23/2021 0.83 0.57 - 1.00 mg/dL Final         Failed - K in normal range and within 180 days    Potassium  Date Value Ref Range Status  01/23/2021 3.5 3.5 - 5.2 mmol/L Final  05/22/2011 3.2 (L) 3.5 - 5.1 mmol/L Final    Comment:    POTASSIUM - Slight hemolysis, interpret results with  - caution.          Failed - Valid encounter within last 6 months    Recent Outpatient Visits           11 months ago Annual physical exam   [place], Devonne Doughty, DO   2 years ago Morbid obesity with BMI of 40.0-44.9, adult [facility])   [place], DO   2 years ago Annual physical exam   [place], DO   3 years ago Acute pain of left shoulder   [facility] [place], Devonne Doughty, DO   4 years ago Morbid obesity with BMI of 40.0-44.9, adult [facility])   [place], DO              Passed - Patient is not pregnant      Passed - Last BP in normal range    BP Readings from Last 1 Encounters:  01/11/21 124/68

## 2022-01-31 ENCOUNTER — Other Ambulatory Visit: Payer: Self-pay | Admitting: Internal Medicine

## 2022-01-31 DIAGNOSIS — E041 Nontoxic single thyroid nodule: Secondary | ICD-10-CM

## 2022-02-04 ENCOUNTER — Other Ambulatory Visit: Payer: Self-pay | Admitting: Family Medicine

## 2022-02-04 DIAGNOSIS — K219 Gastro-esophageal reflux disease without esophagitis: Secondary | ICD-10-CM

## 2022-02-06 NOTE — Telephone Encounter (Signed)
Unable to refill per protocol, Rx request is too soon. Last refill 05/24/21 for 90 and 2 refills. Patient needs OV. Will refuse.  Requested Prescriptions  Pending Prescriptions Disp Refills   omeprazole (PRILOSEC) 40 MG capsule [Pharmacy Med Name: Omeprazole 40 MG Oral Capsule Delayed Release] 90 capsule 3    Sig: TAKE 1 CAPSULE BY MOUTH DAILY     Gastroenterology: Proton Pump Inhibitors Failed - 02/04/2022 11:09 PM      Failed - Valid encounter within last 12 months    Recent Outpatient Visits           1 year ago Annual physical exam   [place], Devonne Doughty, DO   2 years ago Morbid obesity with BMI of 40.0-44.9, adult [facility])   [place], DO   2 years ago Annual physical exam   [facility] Olin Hauser, DO   3 years ago Acute pain of left shoulder   [facility] [place], Devonne Doughty, DO   4 years ago Morbid obesity with BMI of 40.0-44.9, adult [facility])   [facility] [place], Devonne Doughty, [place]

## 2022-02-19 ENCOUNTER — Inpatient Hospital Stay: Admission: RE | Admit: 2022-02-19 | Payer: Managed Care, Other (non HMO) | Source: Ambulatory Visit

## 2022-02-23 ENCOUNTER — Encounter: Payer: Managed Care, Other (non HMO) | Admitting: Family Medicine

## 2022-03-01 ENCOUNTER — Other Ambulatory Visit: Payer: Self-pay | Admitting: Family Medicine

## 2022-03-01 DIAGNOSIS — I10 Essential (primary) hypertension: Secondary | ICD-10-CM

## 2022-03-01 NOTE — Telephone Encounter (Signed)
Requested medications are due for refill today.  yes  Requested medications are on the active medications list.  yes  Last refill. 12/21/2021 #90 0 rf  Future visit scheduled.   no  Notes to clinic.  Labs are expired. Pt needs an appt.    Requested Prescriptions  Pending Prescriptions Disp Refills   losartan (COZAAR) 100 MG tablet [Pharmacy Med Name: Losartan Potassium 100 MG Oral Tablet] 90 tablet 3    Sig: TAKE 1 TABLET BY MOUTH DAILY     Cardiovascular:  Angiotensin Receptor Blockers Failed - 03/01/2022  5:28 AM      Failed - Cr in normal range and within 180 days    Creat  Date Value Ref Range Status  03/13/2016 0.81 0.50 - 1.10 mg/dL Final   Creatinine, Ser  Date Value Ref Range Status  01/23/2021 0.83 0.57 - 1.00 mg/dL Final         Failed - K in normal range and within 180 days    Potassium  Date Value Ref Range Status  01/23/2021 3.5 3.5 - 5.2 mmol/L Final  05/22/2011 3.2 (L) 3.5 - 5.1 mmol/L Final    Comment:    POTASSIUM - Slight hemolysis, interpret results with  - caution.          Failed - Valid encounter within last 6 months    Recent Outpatient Visits           1 year ago Annual physical exam   [place], Devonne Doughty, DO   2 years ago Morbid obesity with BMI of 40.0-44.9, adult [facility])   [place], DO   2 years ago Annual physical exam   [facility] Olin Hauser, DO   3 years ago Acute pain of left shoulder   [facility] [place], Devonne Doughty, DO   4 years ago Morbid obesity with BMI of 40.0-44.9, adult [facility])   [place], [place] - Patient is not pregnant      Passed - Last BP in normal range    BP Readings from Last 1 Encounters:  01/11/21 124/68

## 2022-09-25 ENCOUNTER — Ambulatory Visit
Admission: RE | Admit: 2022-09-25 | Discharge: 2022-09-25 | Disposition: A | Discharge status: Home / Self Care | Payer: Managed Care, Other (non HMO) | Source: Ambulatory Visit | Attending: Internal Medicine | Admitting: Internal Medicine

## 2022-09-25 DIAGNOSIS — E041 Nontoxic single thyroid nodule: Secondary | ICD-10-CM

## 2022-10-04 ENCOUNTER — Other Ambulatory Visit: Payer: Self-pay | Admitting: Internal Medicine

## 2022-10-04 DIAGNOSIS — E041 Nontoxic single thyroid nodule: Secondary | ICD-10-CM

## 2022-10-11 ENCOUNTER — Encounter: Payer: Self-pay | Admitting: Family Medicine

## 2022-10-11 ENCOUNTER — Ambulatory Visit (INDEPENDENT_AMBULATORY_CARE_PROVIDER_SITE_OTHER): Payer: Managed Care, Other (non HMO) | Admitting: Family Medicine

## 2022-10-11 VITALS — BP 134/86 | HR 73 | Ht 68.11 in | Wt 257.0 lb

## 2022-10-11 DIAGNOSIS — I10 Essential (primary) hypertension: Secondary | ICD-10-CM | POA: Diagnosis not present

## 2022-10-11 DIAGNOSIS — K219 Gastro-esophageal reflux disease without esophagitis: Secondary | ICD-10-CM | POA: Diagnosis not present

## 2022-10-11 DIAGNOSIS — Z0000 Encounter for general adult medical examination without abnormal findings: Secondary | ICD-10-CM

## 2022-10-11 DIAGNOSIS — Z1231 Encounter for screening mammogram for malignant neoplasm of breast: Secondary | ICD-10-CM

## 2022-10-11 DIAGNOSIS — R7309 Other abnormal glucose: Secondary | ICD-10-CM

## 2022-10-11 DIAGNOSIS — E039 Hypothyroidism, unspecified: Secondary | ICD-10-CM | POA: Diagnosis not present

## 2022-10-11 MED ORDER — OMEPRAZOLE 40 MG PO CPDR: 40.0000 mg | DELAYED_RELEASE_CAPSULE | Freq: Every day | ORAL | 3 refills | Status: DC

## 2022-10-11 MED ORDER — HYDROCHLOROTHIAZIDE 25 MG PO TABS: 25.0000 mg | ORAL_TABLET | Freq: Every day | ORAL | 3 refills | Status: DC

## 2022-10-11 MED ORDER — LOSARTAN POTASSIUM 100 MG PO TABS: 100.0000 mg | ORAL_TABLET | Freq: Every day | ORAL | 3 refills | Status: DC

## 2022-10-11 NOTE — Progress Notes (Signed)
Subjective:    Patient ID: Ashley Clarke, female    DOB: January 15, 1976, 47 y.o.   MRN: [id number]  Ashley Clarke is a 47 y.o. female presenting on 10/11/2022 for Annual Exam   HPI  Discussed the use of AI scribe software for clinical note transcription with the patient, who gave verbal consent to proceed.  Here for Annual Physical and Lab Orders / [facility].   CHRONIC HTN: Reports BP checks occasionally Out of HCTZ Current Meds - hydrochlorothiazide 25mg  daily, Losartan 100mg  daily  - out of losartan Reports good compliance Tolerating well, w/o complaints. Admits lower legs Denies CP, dyspnea, HA, edema, dizziness / lightheadedness   Morbid Obesity BMI >38 Down 20 lbs >1+ year Working on lifestyle diet exercise Last A1c 5.7 due for lab more active recently, engaging in yard work and other outdoor activities.   Adjustment disorder mood Mother passed away 02/11/21she has been trying to self manage and cope. She has had some down mood at times but declines depression.      FOLLOW-UP Hypothyroidism / Multinodular Goiter Following w Dr Sharl Ma [facility] Recently seen and dose increased from Levothyroxine 137 up to 150 mcg, she mentions weight based dosing Also identified thyroid nodule, on Ultrasound, will follow up with biopsy next week 10/3   Tobacco Abuse Still active smoker, she is down to weaning cigarettes down to 5-6 per day, she still has problem with stressful parts of day morning. Has been successful in reducing, and wants to decline NRT or medication at this time. goal to continue cut back to cold [place] quit within 3 months, goals to quit smoking and lose weight by son's wedding April 2022     Health Maintenance:   Discussion on Colon CA Screening, considering ColoFIT through [facility] vs Cologuard, anticipate cologuard if address situation can be resolved.   [facility] 2016[facility], she will re schedule.   Mammogram is  overdue, no prior screening.       10/11/2022   10:22 AM 01/11/2021    1:39 PM 11/03/2019    3:51 PM  Depression screen PHQ 2/9  Decreased Interest 1 2 0  Down, Depressed, Hopeless 1 1 0  PHQ - 2 Score 2 3 0  Altered sleeping 1 2   Tired, decreased energy 0 0   Change in appetite 1 0   Feeling bad or failure about yourself  1 0   Trouble concentrating 1 0   Moving slowly or fidgety/restless 0 0   Suicidal thoughts 0 0   PHQ-9 Score 6 5   Difficult doing work/chores Not difficult at all Not difficult at all     Past Medical History:  Diagnosis Date   Hyperlipidemia    Hypertension    Hypothyroidism    PONV (postoperative nausea and vomiting)    Sleep apnea 2011   Past Surgical History:  Procedure Laterality Date   ABDOMINAL HYSTERECTOMY  2012   APPENDECTOMY     CHOLECYSTECTOMY  1995   DG GALL BLADDER     KNEE SURGERY     SUPRACERVICAL ABDOMINAL HYSTERECTOMY  2014   reported by patient, do not have op report   TONSILLECTOMY     TUBAL LIGATION  1999   Social History   Socioeconomic History   Marital status: Divorced    Spouse name: Not on file   Number of children: Not on file   Years of education: Not on file   Highest  education level: GED or equivalent  Occupational History   Occupation: Paramedic  Tobacco Use   Smoking status: Some Days    Current packs/day: 0.25    Average packs/day: 0.3 packs/day for 30.0 years (7.5 ttl pk-yrs)    Types: Cigarettes   Smokeless tobacco: Never   Tobacco comments:    < 0.5ppd avg smoking  Vaping Use   Vaping status: Never Used  Substance and Sexual Activity   Alcohol use: No   Drug use: No   Sexual activity: Not Currently    Birth control/protection: Surgical    Comment: Hysterectomy  Other Topics Concern   Not on file  Social History Narrative   Not on file   Social Determinants of Health   Financial Resource Strain: Low Risk  (10/10/2022)   Overall Financial Resource Strain (CARDIA)    Difficulty of  Paying Living Expenses: Not very hard  Food Insecurity: Food Insecurity Present (10/10/2022)   Hunger Vital Sign    Worried About Running Out of Food in the Last Year: Sometimes true    Ran Out of Food in the Last Year: Sometimes true  Transportation Needs: No Transportation Needs (10/10/2022)   PRAPARE - Administrator, Civil Service (Medical): No    Lack of Transportation (Non-Medical): No  Physical Activity: Insufficiently Active (10/10/2022)   Exercise Vital Sign    Days of Exercise per Week: 2 days    Minutes of Exercise per Session: 30 min  Stress: Stress Concern Present (10/10/2022)   [employer] of Occupational Health - Occupational Stress Questionnaire    Feeling of Stress : To some extent  Social Connections: Moderately Isolated (10/10/2022)   Social Connection and Isolation Panel [NHANES]    Frequency of Communication with Friends and Family: More than three times a week    Frequency of Social Gatherings with Friends and Family: Twice a week    Attends Religious Services: 1 to 4 times per year    Active Member of [employer] or Organizations: No    Attends Engineer, structural: Not on file    Marital Status: Divorced  Catering manager Violence: Not on file   Family History  Problem Relation Age of Onset   Stroke Paternal Aunt    Breast cancer Paternal Aunt 60   Stroke Maternal Grandmother    Hyperlipidemia Mother    Hypertension Mother    Varicose Veins Mother    Diabetes Father    Hyperlipidemia Father    Hypertension Father    Depression Sister    Colon cancer Neg Hx    Current Outpatient Medications on File Prior to Visit  Medication Sig   levothyroxine (SYNTHROID) 150 MCG tablet Take 1 tablet (150 mcg total) by mouth daily before breakfast.   No current facility-administered medications on file prior to visit.    Review of Systems  Constitutional:  Negative for activity change, appetite change, chills, diaphoresis, fatigue and fever.   HENT:  Negative for congestion and hearing loss.   Eyes:  Negative for visual disturbance.  Respiratory:  Negative for cough, chest tightness, shortness of breath and wheezing.   Cardiovascular:  Negative for chest pain, palpitations and leg swelling.  Gastrointestinal:  Negative for abdominal pain, constipation, diarrhea, nausea and vomiting.  Genitourinary:  Negative for dysuria, frequency and hematuria.  Musculoskeletal:  Negative for arthralgias and neck pain.  Skin:  Negative for rash.  Neurological:  Negative for dizziness, weakness, light-headedness, numbness and headaches.  Hematological:  Negative for  adenopathy.  Psychiatric/Behavioral:  Negative for behavioral problems, dysphoric mood and sleep disturbance.    Per HPI unless specifically indicated above      Objective:    BP 134/86   Pulse 73   Ht 5' 8.11" (1.73 m)   Wt 257 lb (116.6 kg)   SpO2 94%   BMI 38.95 kg/m   Wt Readings from Last 3 Encounters:  10/11/22 257 lb (116.6 kg)  01/11/21 279 lb (126.6 kg)  11/03/19 292 lb 9.6 oz (132.7 kg)    Physical Exam Vitals and nursing note reviewed.  Constitutional:      General: She is not in acute distress.    Appearance: She is well-developed. She is obese. She is not diaphoretic.     Comments: Well-appearing, comfortable, cooperative  HENT:     Head: Normocephalic and atraumatic.  Eyes:     General:        Right eye: No discharge.        Left eye: No discharge.     Conjunctiva/sclera: Conjunctivae normal.     Pupils: Pupils are equal, round, and reactive to light.  Neck:     Thyroid: No thyromegaly.  Cardiovascular:     Rate and Rhythm: Normal rate and regular rhythm.     Pulses: Normal pulses.     Heart sounds: Normal heart sounds. No murmur heard. Pulmonary:     Effort: Pulmonary effort is normal. No respiratory distress.     Breath sounds: Normal breath sounds. No wheezing or rales.  Abdominal:     General: Bowel sounds are normal. There is no  distension.     Palpations: Abdomen is soft. There is no mass.     Tenderness: There is no abdominal tenderness.  Musculoskeletal:        General: No tenderness. Normal range of motion.     Cervical back: Normal range of motion and neck supple.     Right lower leg: Edema (trace edema) present.     Left lower leg: Edema present.     Comments: Upper / Lower Extremities: - Normal muscle tone, strength bilateral upper extremities 5/5, lower extremities 5/5  Lymphadenopathy:     Cervical: No cervical adenopathy.  Skin:    General: Skin is warm and dry.     Findings: No erythema or rash.  Neurological:     Mental Status: She is alert and oriented to person, place, and time.     Comments: Distal sensation intact to light touch all extremities  Psychiatric:        Mood and Affect: Mood normal.        Behavior: Behavior normal.        Thought Content: Thought content normal.     Comments: Well groomed, good eye contact, normal speech and thoughts      Results for orders placed or performed in visit on 02/13/21  Surgical pathology  Result Value Ref Range   SURGICAL PATHOLOGY      Surgical Pathology CASE: [id number] PATIENT: Lowanda Foster Surgical Pathology Report     Specimen Submitted: A. Bile papule, right buccal mucosa  Clinical History: D37.09      DIAGNOSIS: A. BUCCAL MUCOSA, RIGHT; BIOPSY: - IRRITATION FIBROMA. - NEGATIVE FOR MALIGNANCY.   GROSS DESCRIPTION: A. Labeled: Right buccal mucosa bile papule Received: Formalin Collection time: 4:30 PM on 02/13/2021 Placed into formalin time: 4:03 PM on 02/13/2021 Tissue fragment(s): 1 Size: 0.7 x 0.5 x 0.5 cm Description: Received is a fragment of white  finely lobulated mucosa. The resection margin is inked green and the specimen is bisected. Entirely submitted in 1 cassette.  RB 02/14/2021   Final Diagnosis performed by Georgeanna Harrison, MD.   Electronically signed 02/15/2021 9:55:56AM The electronic signature  indicates that the named Attending Pathologist has evaluated the specimen Technical component performed at [facility], [address] [place], [place] [address] Lab: [phone] Dir: Jolene Schimke, MD, MMM  Professional component performed at [facility], [address] [place], [place], [place] [address] Lab: [phone] Dir: Beryle Quant, MD       Assessment & Plan:   Problem List Items Addressed This Visit     Adult hypothyroidism   Relevant Medications   levothyroxine (SYNTHROID) 150 MCG tablet   Essential (primary) hypertension   Relevant Medications   hydrochlorothiazide (HYDRODIURIL) 25 MG tablet   losartan (COZAAR) 100 MG tablet   Other Relevant Orders   CBC with Differential/Platelet   Comprehensive metabolic panel   GERD (gastroesophageal reflux disease)   Relevant Medications   omeprazole (PRILOSEC) 40 MG capsule   Other Visit Diagnoses     Annual physical exam    -  Primary   Relevant Orders   Hemoglobin A1c   Lipid panel   CBC with Differential/Platelet   Comprehensive metabolic panel   Encounter for screening mammogram for malignant neoplasm of breast       Relevant Orders   MM 3D SCREENING MAMMOGRAM BILATERAL BREAST   Elevated hemoglobin A1c       Relevant Orders   Hemoglobin A1c       Updated Health Maintenance information Reviewed recent lab results with patient Encouraged improvement to lifestyle with diet and exercise Goal of weight loss  Assessment and Plan    Hypothyroidism Managed by Dr. Sharl Ma at [facility]. Recently increased Levothyroxine dose to due to weight-based dosing. Thyroid nodule identified on ultrasound with biopsy scheduled next week. -Continue Levothyroxine daily. -Follow up with Dr. Sharl Ma for biopsy results and further management.  Hypertension Reports fluid retention in ankles, improved with medication. -Continue Losartan and Hydrochlorothiazide as prescribed. -Check blood  pressure occasionally at home.  Colon Cancer Screening Discussed difference between Colofit and Cologuard. Patient prefers Cologuard if address issue can be resolved. -Plan to order Cologuard when patient provides alternate address.  Breast Cancer Screening Mammogram not done as previously planned. -Order mammogram at [facility].  Cervical Cancer Screening Pap smear overdue. -Contact [facility] to schedule routine visit and Pap smear.  General Health Maintenance -Order comprehensive blood panel excluding thyroid tests. -Encourage increased physical activity. -Schedule annual physical exam.      Print [facility] orders given to patient.  Orders Placed This Encounter  Procedures   MM 3D SCREENING MAMMOGRAM BILATERAL BREAST    Standing Status:   Future    Standing Expiration Date:   10/11/2023    Order Specific Question:   Reason for Exam (SYMPTOM  OR DIAGNOSIS REQUIRED)    Answer:   Screening bilateral 3D Mammogram Tomo    Order Specific Question:   Preferred imaging location?    Answer:   [facility]   Hemoglobin A1c   Lipid panel    Order Specific Question:   Has the patient fasted?    Answer:   Yes   CBC with Differential/Platelet   Comprehensive metabolic panel    Order Specific Question:   Has the patient fasted?    Answer:   Yes     Meds ordered this  encounter  Medications   hydrochlorothiazide (HYDRODIURIL) 25 MG tablet    Sig: Take 1 tablet (25 mg total) by mouth daily.    Dispense:  90 tablet    Refill:  3    Request 1 year   losartan (COZAAR) 100 MG tablet    Sig: Take 1 tablet (100 mg total) by mouth daily.    Dispense:  90 tablet    Refill:  3    Requesting 1 year supply.   omeprazole (PRILOSEC) 40 MG capsule    Sig: Take 1 capsule (40 mg total) by mouth daily.    Dispense:  90 capsule    Refill:  3    Requesting 1 year supply      Follow up plan: Return in about 1 year (around 10/11/2023) for 1 year Annual  Physical.  Saralyn Pilar, DO [facility] 10/11/2022, 10:21 AM

## 2022-10-11 NOTE — Patient Instructions (Addendum)
Thank you for coming to the office today.  For Mammogram screening for breast cancer   Call the Imaging Center below anytime to schedule your own appointment now that order has been placed.  [facility] at [facility] [address] [place], [place] [address] Phone: [phone]  --------------------------  New location - for GYN  [facility] at [facility] [address] [place],  [place]  [address] Main: [phone]  See if they can get you scheduled for routine visit pap smear etc  If we cannot get you back in, we can go to [facility] or [employer] of options in [place]  ------------------------------------------------  Message back when ready w/ alternate address and decision.  We will plan to order the Cologuard (home kit) test for colon cancer screening. Stay tuned for further updates.  It will be shipped to you directly. If not received in 2-4 weeks, call us or the company.   If you send it back and no results are received in 2-4 weeks, call us or the company as well!   Colon Cancer Screening: - For all adults age 82+ routine colon cancer screening is highly recommended.     - Recent guidelines from American Cancer Society recommend starting age of 67 - Early detection of colon cancer is important, because often there are no warning signs or symptoms, also if found early usually it can be cured. Late stage is hard to treat.   - If Cologuard is NEGATIVE, then it is good for 3 years before next due - If Cologuard is POSITIVE, then it is strongly advised to get a Colonoscopy, which allows the GI doctor to locate the source of the cancer or polyp (even very early stage) and treat it by removing it. ------------------------- Follow instructions to collect sample, you may call the company for any help or questions, 24/7 telephone support at [phone].  DUE for FASTING BLOOD  WORK (no food or drink after midnight before the lab appointment, only water or coffee without cream/sugar on the morning of)  [facility]  For Lab Results, once available within 2-3 days of blood draw, you can can log in to MyChart online to view your results and a brief explanation. Also, we can discuss results at next follow-up visit.    Please schedule a Follow-up Appointment to: Return in about 1 year (around 10/11/2023) for 1 year Annual Physical.  If you have any other questions or concerns, please feel free to call the office or send a message through MyChart. You may also schedule an earlier appointment if necessary.  Additionally, you may be receiving a survey about your experience at our office within a few days to 1 week by e-mail or mail. We value your feedback.  Saralyn Pilar, DO [facility], [place]

## 2022-10-18 ENCOUNTER — Other Ambulatory Visit (HOSPITAL_COMMUNITY)
Admission: RE | Admit: 2022-10-18 | Discharge: 2022-10-18 | Disposition: A | Discharge status: Home / Self Care | Payer: Managed Care, Other (non HMO) | Source: Ambulatory Visit | Attending: Internal Medicine | Admitting: Internal Medicine

## 2022-10-18 ENCOUNTER — Ambulatory Visit
Admission: RE | Admit: 2022-10-18 | Discharge: 2022-10-18 | Disposition: A | Discharge status: Home / Self Care | Payer: Managed Care, Other (non HMO) | Source: Ambulatory Visit | Attending: Internal Medicine | Admitting: Internal Medicine

## 2022-10-18 DIAGNOSIS — E041 Nontoxic single thyroid nodule: Secondary | ICD-10-CM | POA: Diagnosis present

## 2022-10-18 NOTE — Procedures (Signed)
Interventional Radiology Procedure Note  Procedure:  US guided FNA biopsy of isthmic thyroid nodule Complications: None Recommendations:  - Ok to shower tomorrow - routine wound care  Signed,  Yvone Neu. Loreta Ave, DO, ABVM, RPVI

## 2022-10-22 LAB — CYTOLOGY - NON PAP

## 2022-10-23 ENCOUNTER — Ambulatory Visit
Admission: RE | Admit: 2022-10-23 | Discharge: 2022-10-23 | Disposition: A | Discharge status: Home / Self Care | Payer: Managed Care, Other (non HMO) | Source: Ambulatory Visit | Attending: Family Medicine | Admitting: Family Medicine

## 2022-10-23 DIAGNOSIS — Z1231 Encounter for screening mammogram for malignant neoplasm of breast: Secondary | ICD-10-CM | POA: Diagnosis present

## 2022-11-14 LAB — CBC WITH DIFFERENTIAL/PLATELET
Basophils Absolute: 0.1 10*3/uL (ref 0.0–0.2)
Basos: 1 %
EOS (ABSOLUTE): 0.2 10*3/uL (ref 0.0–0.4)
Eos: 3 %
Hematocrit: 46.1 % (ref 34.0–46.6)
Hemoglobin: 14.8 g/dL (ref 11.1–15.9)
Immature Grans (Abs): 0 10*3/uL (ref 0.0–0.1)
Immature Granulocytes: 0 %
Lymphocytes Absolute: 2.6 10*3/uL (ref 0.7–3.1)
Lymphs: 29 %
MCH: 28.8 pg (ref 26.6–33.0)
MCHC: 32.1 g/dL (ref 31.5–35.7)
MCV: 90 fL (ref 79–97)
Monocytes Absolute: 0.5 10*3/uL (ref 0.1–0.9)
Monocytes: 5 %
Neutrophils Absolute: 5.4 10*3/uL (ref 1.4–7.0)
Neutrophils: 62 %
Platelets: 291 10*3/uL (ref 150–450)
RBC: 5.14 x10E6/uL (ref 3.77–5.28)
RDW: 13.6 % (ref 11.7–15.4)
WBC: 8.8 10*3/uL (ref 3.4–10.8)

## 2022-11-14 LAB — COMPREHENSIVE METABOLIC PANEL
ALT: 21 [IU]/L (ref 0–32)
AST: 16 [IU]/L (ref 0–40)
Albumin: 4.2 g/dL (ref 3.9–4.9)
Alkaline Phosphatase: 93 [IU]/L (ref 44–121)
BUN/Creatinine Ratio: 11 (ref 9–23)
BUN: 10 mg/dL (ref 6–24)
Bilirubin Total: 1 mg/dL (ref 0.0–1.2)
CO2: 29 mmol/L (ref 20–29)
Calcium: 9.2 mg/dL (ref 8.7–10.2)
Chloride: 96 mmol/L (ref 96–106)
Creatinine, Ser: 0.87 mg/dL (ref 0.57–1.00)
Globulin, Total: 3.1 g/dL (ref 1.5–4.5)
Glucose: 113 mg/dL — ABNORMAL HIGH (ref 70–99)
Potassium: 3.2 mmol/L — ABNORMAL LOW (ref 3.5–5.2)
Sodium: 138 mmol/L (ref 134–144)
Total Protein: 7.3 g/dL (ref 6.0–8.5)
eGFR: 82 mL/min/{1.73_m2} (ref >59)

## 2022-11-14 LAB — LIPID PANEL
Chol/HDL Ratio: 5.5 {ratio} — ABNORMAL HIGH (ref 0.0–4.4)
Cholesterol, Total: 186 mg/dL (ref 100–199)
HDL: 34 mg/dL — ABNORMAL LOW (ref >39)
LDL Chol Calc (NIH): 129 mg/dL — ABNORMAL HIGH (ref 0–99)
Triglycerides: 125 mg/dL (ref 0–149)
VLDL Cholesterol Cal: 23 mg/dL (ref 5–40)

## 2022-11-14 LAB — HEMOGLOBIN A1C
Est. average glucose Bld gHb Est-mCnc: 123 mg/dL
Hgb A1c MFr Bld: 5.9 % — ABNORMAL HIGH (ref 4.8–5.6)

## 2023-03-11 ENCOUNTER — Encounter: Payer: Self-pay | Admitting: Family Medicine

## 2023-03-11 DIAGNOSIS — R519 Headache, unspecified: Secondary | ICD-10-CM

## 2023-03-11 DIAGNOSIS — E8940 Asymptomatic postprocedural ovarian failure: Secondary | ICD-10-CM

## 2023-03-12 NOTE — Addendum Note (Signed)
 Addended by: Smitty Cords on: 03/12/2023 06:17 PM   Modules accepted: Orders

## 2023-03-25 ENCOUNTER — Ambulatory Visit
Admission: RE | Admit: 2023-03-25 | Discharge: 2023-03-25 | Disposition: A | Discharge status: Home / Self Care | Source: Ambulatory Visit | Attending: Family Medicine | Admitting: Family Medicine

## 2023-03-25 DIAGNOSIS — R519 Headache, unspecified: Secondary | ICD-10-CM | POA: Diagnosis present

## 2023-04-09 ENCOUNTER — Encounter: Payer: Self-pay | Admitting: Family Medicine

## 2023-04-19 ENCOUNTER — Encounter: Payer: Self-pay | Admitting: Family Medicine

## 2023-07-13 IMAGING — US US THYROID
1 series · 13 of 25 positions shown · non-contrast
Comparison: 05/08/2019, 02/13/2016, 01/30/2016

CLINICAL DATA: Prior ultrasound follow-up.

EXAM:
THYROID ULTRASOUND
TECHNIQUE: Ultrasound examination of the thyroid gland and adjacent soft
tissues was performed.

[Series 1: us thyroid · 0.07mm/px · 13 of 77 slices shown]
[im 1/77]
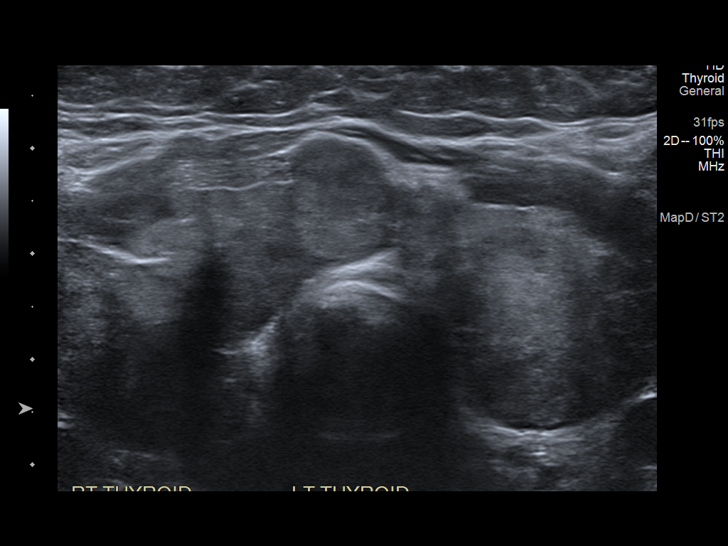
[im 7/77]
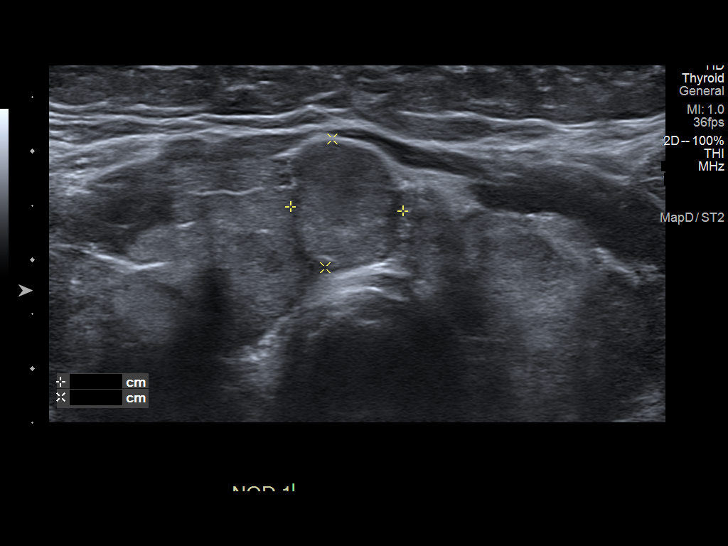
[im 13/77]
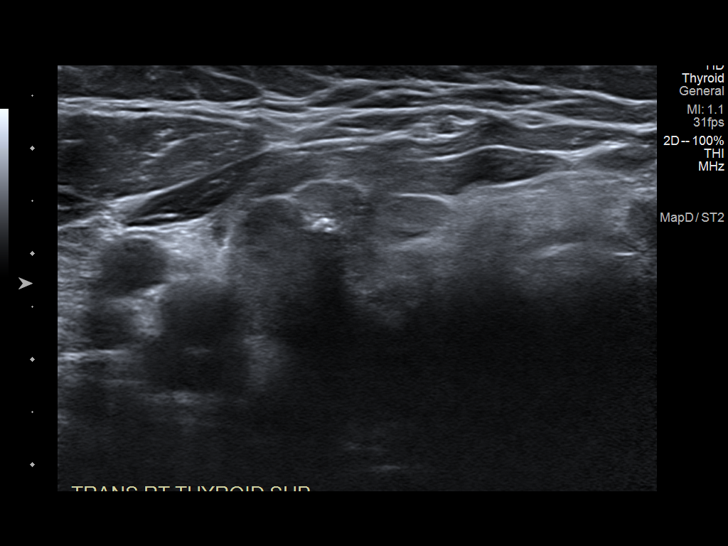
[im 20/77]
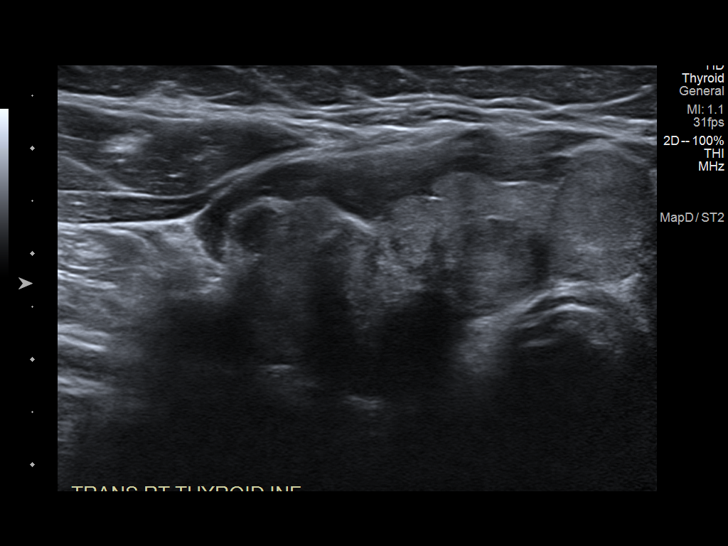
[im 26/77]
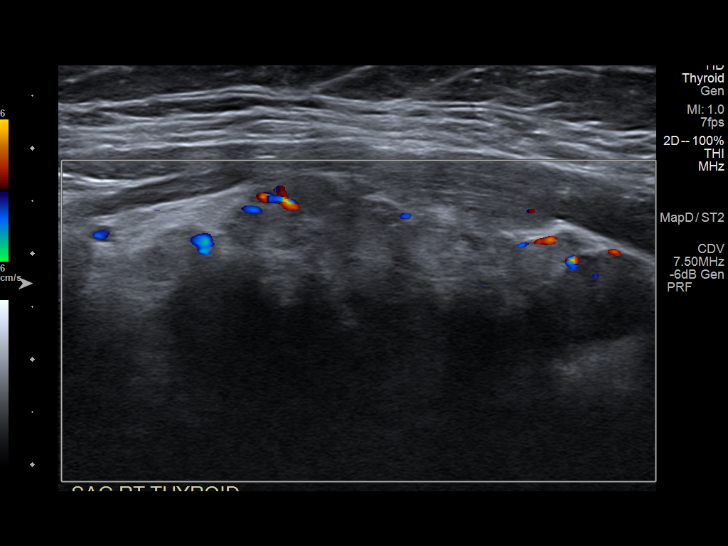
[im 32/77]
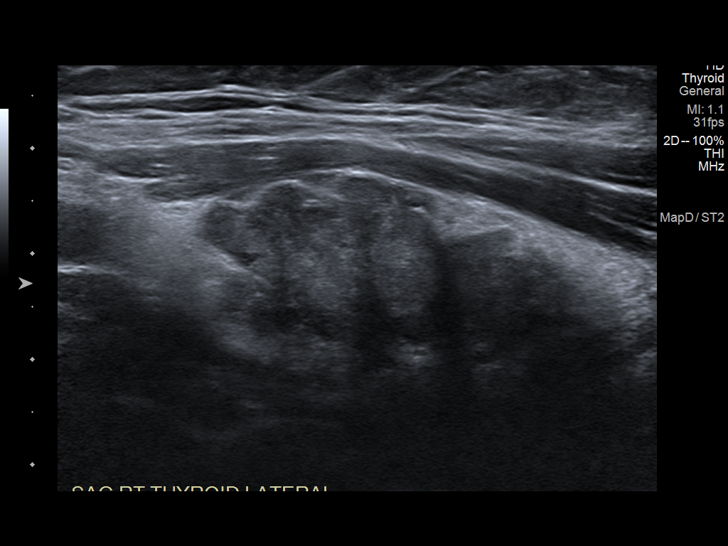
[im 39/77]
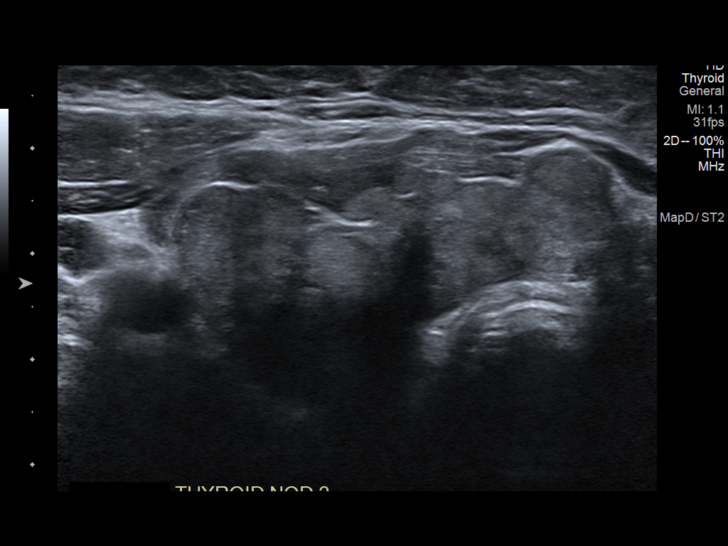
[im 45/77]
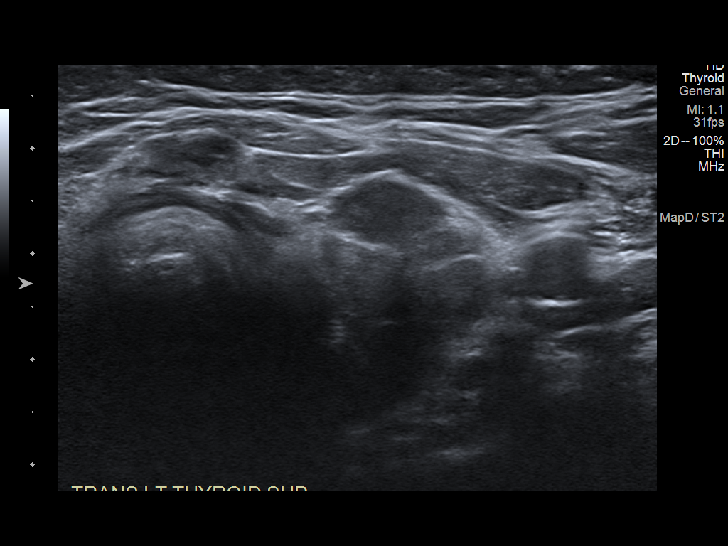
[im 51/77]
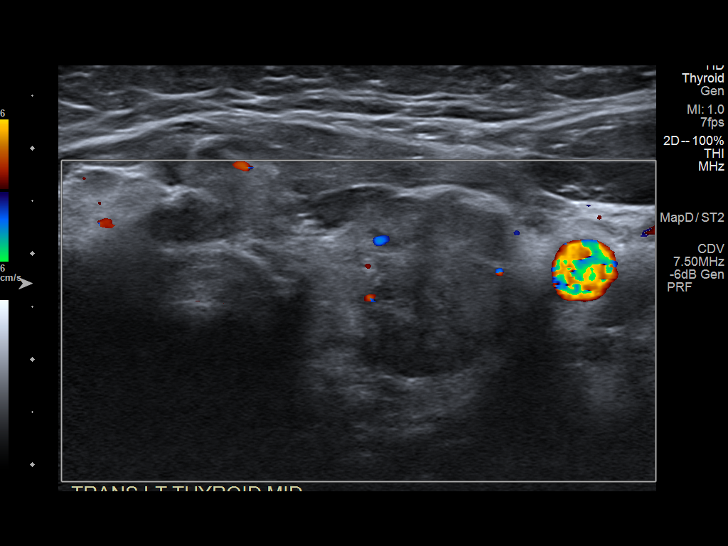
[im 58/77]
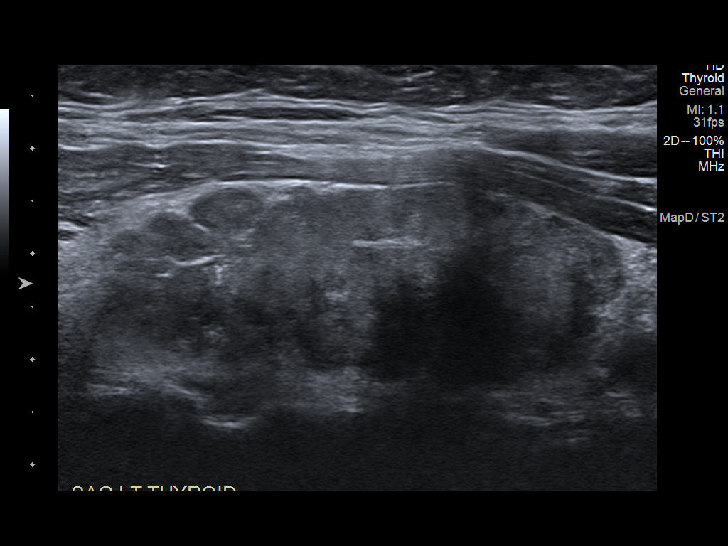
[im 64/77]
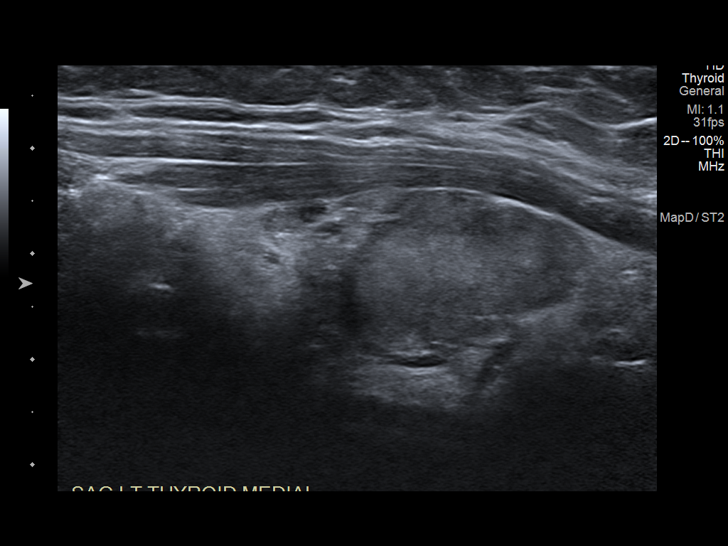
[im 70/77]
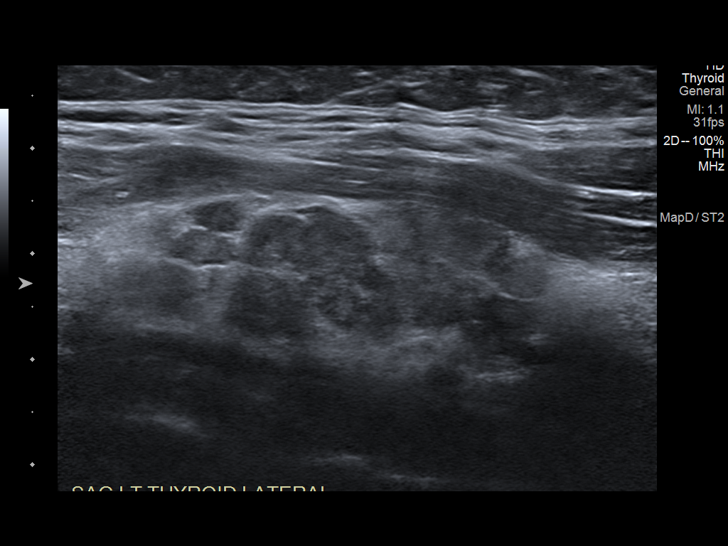
[im 77/77]
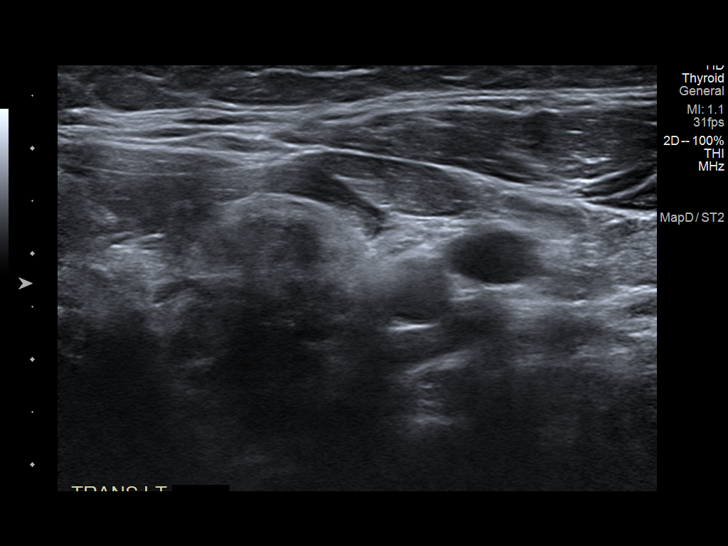

[13 of 25 positions shown; findings below may reference images not displayed]

FINDINGS: Parenchymal Echotexture: Markedly heterogenous

Isthmus: 1.2 cm, previously 1.2 cm

Right lobe: 5.7 x 2.2 x 2.6 cm, previously 4.8 x 2.2 x 2.3 cm

Left lobe: 6.2 x 2.2 x 2.2 cm, previously 4.7 x 1.9 x 2.5 cm

_________________________________________________________

Estimated total number of nodules >/= 1 cm: 3

Number of spongiform nodules >/=  2 cm not described below (TR1): 0

Number of mixed cystic and solid nodules >/= 1.5 cm not described
below (TR2): 0

_________________________________________________________

Nodule # 1:

Prior biopsy: No

Location: Isthmus; Mid

Maximum size: 1.5 cm; Other 2 dimensions: 1.2 x 1.0 cm, previously,
1.2 x 1.1 x 0.8 cm

Composition: solid/almost completely solid (2)

Echogenicity: isoechoic (1)

Shape: not taller-than-wide (0)

Margins: smooth (0)

Echogenic foci: none (0)

ACR TI-RADS total points: 3.

ACR TI-RADS risk category:  TR3 (3 points).

Significant change in size (>/= 20% in two dimensions and minimal
increase of 2 mm): No

Change in features: No

Change in ACR TI-RADS risk category: No

ACR TI-RADS recommendations:

*Given size (>/= 1.5 - 2.4 cm) and appearance, a follow-up
ultrasound in 1 year should be considered based on TI-RADS criteria.

_________________________________________________________

Previously visualized discrete nodules in the right and left thyroid
are no longer conspicuous. There is diffuse pseudo nodularity
bilaterally. No cervical lymphadenopathy.
IMPRESSION: 1. Similar appearing multinodular goiter with diffuse
pseudonodularity of the bilateral thyroid lobes. No new discrete
nodules.
2. Slight interval increased size of previously visualized solid
nodule in the isthmus (labeled 1, 1.5 cm, previously 1.2 cm) which
meets criteria (TI-RADS category 3) for 1 year ultrasound
surveillance.

The above is in keeping with the ACR TI-RADS recommendations - [HOSPITAL] 3686;[DATE].

## 2023-07-29 ENCOUNTER — Other Ambulatory Visit: Payer: Self-pay | Admitting: Family Medicine

## 2023-07-29 DIAGNOSIS — K219 Gastro-esophageal reflux disease without esophagitis: Secondary | ICD-10-CM

## 2023-07-29 DIAGNOSIS — I10 Essential (primary) hypertension: Secondary | ICD-10-CM

## 2023-07-31 NOTE — Telephone Encounter (Signed)
 Rx 10/11/22 #90 3RF- still has RF- too soon Requested Prescriptions  Pending Prescriptions Disp Refills   losartan  (COZAAR ) 100 MG tablet [Pharmacy Med Name: Losartan  Potassium 100 MG Oral Tablet] 90 tablet 3    Sig: TAKE 1 TABLET BY MOUTH DAILY     Cardiovascular:  Angiotensin Receptor Blockers Failed - 07/31/2023 11:55 AM      Failed - Cr in normal range and within 180 days    Creat  Date Value Ref Range Status  03/13/2016 0.81 0.50 - 1.10 mg/dL Final   Creatinine, Ser  Date Value Ref Range Status  11/13/2022 0.87 0.57 - 1.00 mg/dL Final         Failed - K in normal range and within 180 days    Potassium  Date Value Ref Range Status  11/13/2022 3.2 (L) 3.5 - 5.2 mmol/L Final  05/22/2011 3.2 (L) 3.5 - 5.1 mmol/L Final    Comment:    POTASSIUM - Slight hemolysis, interpret results with  - caution.          Failed - Valid encounter within last 6 months    Recent Outpatient Visits   None            Passed - Patient is not pregnant      Passed - Last BP in normal range    BP Readings from Last 1 Encounters:  10/11/22 134/86          omeprazole  (PRILOSEC) 40 MG capsule [Pharmacy Med Name: Omeprazole  40 MG Oral Capsule Delayed Release] 90 capsule 3    Sig: TAKE 1 CAPSULE BY MOUTH DAILY     Gastroenterology: Proton Pump Inhibitors Failed - 07/31/2023 11:55 AM      Failed - Valid encounter within last 12 months    Recent Outpatient Visits   None             hydrochlorothiazide  (HYDRODIURIL ) 25 MG tablet [Pharmacy Med Name: hydroCHLOROthiazide  25 MG Oral Tablet] 90 tablet 3    Sig: TAKE 1 TABLET BY MOUTH DAILY     Cardiovascular: Diuretics - Thiazide Failed - 07/31/2023 11:55 AM      Failed - Cr in normal range and within 180 days    Creat  Date Value Ref Range Status  03/13/2016 0.81 0.50 - 1.10 mg/dL Final   Creatinine, Ser  Date Value Ref Range Status  11/13/2022 0.87 0.57 - 1.00 mg/dL Final         Failed - K in normal range and within 180 days     Potassium  Date Value Ref Range Status  11/13/2022 3.2 (L) 3.5 - 5.2 mmol/L Final  05/22/2011 3.2 (L) 3.5 - 5.1 mmol/L Final    Comment:    POTASSIUM - Slight hemolysis, interpret results with  - caution.          Failed - Na in normal range and within 180 days    Sodium  Date Value Ref Range Status  11/13/2022 138 134 - 144 mmol/L Final         Failed - Valid encounter within last 6 months    Recent Outpatient Visits   None            Passed - Last BP in normal range    BP Readings from Last 1 Encounters:  10/11/22 134/86

## 2023-10-14 ENCOUNTER — Ambulatory Visit
Admission: RE | Admit: 2023-10-14 | Discharge: 2023-10-14 | Disposition: A | Discharge status: Home / Self Care | Source: Ambulatory Visit | Attending: Family Medicine | Admitting: Family Medicine

## 2023-10-14 DIAGNOSIS — E8940 Asymptomatic postprocedural ovarian failure: Secondary | ICD-10-CM | POA: Diagnosis present

## 2023-10-15 ENCOUNTER — Ambulatory Visit: Payer: Self-pay | Admitting: Family Medicine

## 2023-11-11 ENCOUNTER — Encounter: Payer: Self-pay | Admitting: Family Medicine

## 2023-11-12 ENCOUNTER — Other Ambulatory Visit: Payer: Self-pay | Admitting: Medical Genetics

## 2023-11-19 ENCOUNTER — Ambulatory Visit: Admitting: Family Medicine

## 2023-11-19 ENCOUNTER — Other Ambulatory Visit: Payer: Self-pay | Admitting: Family Medicine

## 2023-11-19 VITALS — BP 124/80 | HR 55 | Ht 68.11 in | Wt 262.5 lb

## 2023-11-19 DIAGNOSIS — K219 Gastro-esophageal reflux disease without esophagitis: Secondary | ICD-10-CM

## 2023-11-19 DIAGNOSIS — I10 Essential (primary) hypertension: Secondary | ICD-10-CM

## 2023-11-19 DIAGNOSIS — R7309 Other abnormal glucose: Secondary | ICD-10-CM

## 2023-11-19 DIAGNOSIS — Z0000 Encounter for general adult medical examination without abnormal findings: Secondary | ICD-10-CM

## 2023-11-19 DIAGNOSIS — Z789 Other specified health status: Secondary | ICD-10-CM

## 2023-11-19 DIAGNOSIS — E039 Hypothyroidism, unspecified: Secondary | ICD-10-CM | POA: Diagnosis not present

## 2023-11-19 DIAGNOSIS — E782 Mixed hyperlipidemia: Secondary | ICD-10-CM

## 2023-11-19 DIAGNOSIS — Z1211 Encounter for screening for malignant neoplasm of colon: Secondary | ICD-10-CM

## 2023-11-19 MED ORDER — OMEPRAZOLE 40 MG PO CPDR: 40.0000 mg | DELAYED_RELEASE_CAPSULE | Freq: Every day | ORAL | 0 refills | Status: DC

## 2023-11-19 MED ORDER — OMEPRAZOLE 40 MG PO CPDR: 40.0000 mg | DELAYED_RELEASE_CAPSULE | Freq: Every day | ORAL | 3 refills | Status: AC

## 2023-11-19 MED ORDER — LOSARTAN POTASSIUM 100 MG PO TABS: 100.0000 mg | ORAL_TABLET | Freq: Every day | ORAL | 3 refills | Status: AC

## 2023-11-19 MED ORDER — HYDROCHLOROTHIAZIDE 25 MG PO TABS: 25.0000 mg | ORAL_TABLET | Freq: Every day | ORAL | 0 refills | Status: AC

## 2023-11-19 MED ORDER — LOSARTAN POTASSIUM 100 MG PO TABS: 100.0000 mg | ORAL_TABLET | Freq: Every day | ORAL | 0 refills | Status: DC

## 2023-11-19 MED ORDER — HYDROCHLOROTHIAZIDE 25 MG PO TABS: 25.0000 mg | ORAL_TABLET | Freq: Every day | ORAL | 3 refills | Status: DC

## 2023-11-19 NOTE — Patient Instructions (Addendum)
 Thank you for coming to the office today.  [facility] printed orders  Copay will go to next one.  Refilled 30 day + 1 year respectively   You have been referred for a Coronary Calcium Score Cardiac CT Scan. This is a screening test for patients aged 49-50+ with cardiovascular risk factors or who are healthy but would be interested in Cardiovascular Screening for heart disease. Even if there is a family history of heart disease, this imaging can be useful. Typically it can be done every 5+ years or at a different timeline we agree on  The scan will look at the chest and mainly focus on the heart and identify early signs of calcium build up or blockages within the heart arteries. It is not 100% accurate for identifying blockages or heart disease, but it is useful to help us  predict who may have some early changes or be at risk in the future for a heart attack or cardiovascular problem.  The results are reviewed by a Cardiologist and they will document the results. It should become available on MyChart. Typically the results are divided into percentiles based on other patients of the same demographic and age. So it will compare your risk to others similar to you. If you have a higher score >99 or higher percentile >75%tile, it is recommended to consider Statin cholesterol therapy and or referral to Cardiologist. I will try to help explain your results and if we have questions we can contact the Cardiologist.  You will be contacted for scheduling. Usually it is done at any imaging facility through [facility], [facility] or [facility].  The cost is $99 flat fee total and it does not go through insurance, so no authorization is required.   Please schedule a Follow-up Appointment to: Return for 1 year Annual Physical (no labs, she will do [facility]).  If you have any other questions or concerns, please feel free to call the office or send a message through MyChart. You  may also schedule an earlier appointment if necessary.  Additionally, you may be receiving a survey about your experience at our office within a few days to 1 week by e-mail or mail. We value your feedback.  Marsa Officer, DO [facility], [place]

## 2023-11-19 NOTE — Progress Notes (Unsigned)
 Subjective:    Patient ID: Ashley Clarke, female    DOB: [birth year], 48 y.o.   MRN: [id number]  Ashley Clarke is a 48 y.o. female presenting on 11/19/2023 for Medical Management of Chronic Issues   HPI  Discussed the use of AI scribe software for clinical note transcription with the patient, who gave verbal consent to proceed.  History of Present Illness   CHRONIC HTN: Reports BP checks occasionally Out of HCTZ Current Meds - hydrochlorothiazide  25mg  daily, Losartan  100mg  daily  - out of losartan  Reports good compliance Tolerating well, w/o complaints. Admits lower legs Denies CP, dyspnea, HA, edema, dizziness / lightheadedness   Morbid Obesity BMI >38 Down 20 lbs >1+ year Working on lifestyle diet exercise Last A1c 5.7 due for lab more active recently, engaging in yard work and other outdoor activities.   Adjustment disorder mood Mother passed away 02-09-21she has been trying to self manage and cope. She has had some down mood at times but declines depression.      FOLLOW-UP Hypothyroidism / Multinodular Goiter [facility] Recently seen and dose increased from Levothyroxine  137 up to 150 mcg, she mentions weight based dosing Also identified thyroid  nodule, on Ultrasound, will follow up with biopsy next week 10/3   Tobacco Abuse Still active smoker, she is down to weaning cigarettes down to 5-6 per day, she still has problem with stressful parts of day morning. Has been successful in reducing, and wants to decline NRT or medication at this time. goal to continue cut back to cold turkey quit within 3 months, goals to quit smoking and lose weight by son's wedding April 2022     Health Maintenance:   Order Cologuard.   Last pap 2016[facility].   Mammogram is overdue, no prior screening.       11/19/2023    9:31 AM 10/11/2022   10:22 AM 01/11/2021    1:39 PM  Depression screen PHQ 2/9  Decreased  Interest 1 1 2   Down, Depressed, Hopeless 1 1 1   PHQ - 2 Score 2 2 3   Altered sleeping 1 1 2   Tired, decreased energy 0 0 0  Change in appetite 0 1 0  Feeling bad or failure about yourself  0 1 0  Trouble concentrating 0 1 0  Moving slowly or fidgety/restless 0 0 0  Suicidal thoughts 0 0 0  PHQ-9 Score 3 6 5   Difficult doing work/chores Not difficult at all Not difficult at all Not difficult at all       11/19/2023    9:32 AM 10/11/2022   10:22 AM 01/11/2021    1:40 PM  GAD 7 : Generalized Anxiety Score  Nervous, Anxious, on Edge 1 1 1   Control/stop worrying 0 1 1  Worry too much - different things 1 1 1   Trouble relaxing 0 0 0  Restless 0 0 0  Easily annoyed or irritable 0 0 0  Afraid - awful might happen 0 1 0  Total GAD 7 Score 2 4 3   Anxiety Difficulty Not difficult at all       Past Medical History:  Diagnosis Date   Hyperlipidemia    Hypertension    Hypothyroidism    PONV (postoperative nausea and vomiting)    Sleep apnea 2011   Past Surgical History:  Procedure Laterality Date   ABDOMINAL HYSTERECTOMY  2012   APPENDECTOMY     CHOLECYSTECTOMY  1995  DG GALL BLADDER     KNEE SURGERY     SUPRACERVICAL ABDOMINAL HYSTERECTOMY  2014   reported by patient, do not have op report   TONSILLECTOMY     TUBAL LIGATION  1999   Social History   Socioeconomic History   Marital status: Divorced    Spouse name: Not on file   Number of children: Not on file   Years of education: Not on file   Highest education level: GED or equivalent  Occupational History   Occupation: Paramedic  Tobacco Use   Smoking status: Some Days    Current packs/day: 0.25    Average packs/day: 0.3 packs/day for 30.0 years (7.5 ttl pk-yrs)    Types: Cigarettes   Smokeless tobacco: Never   Tobacco comments:    < 0.5ppd avg smoking  Vaping Use   Vaping status: Never Used  Substance and Sexual Activity   Alcohol use: No   Drug use: No   Sexual activity: Not Currently    Birth  control/protection: Surgical    Comment: Hysterectomy  Other Topics Concern   Not on file  Social History Narrative   Not on file   Social Drivers of Health   Financial Resource Strain: Medium Risk (11/19/2023)   Overall Financial Resource Strain (CARDIA)    Difficulty of Paying Living Expenses: Somewhat hard  Food Insecurity: Food Insecurity Present (11/19/2023)   Hunger Vital Sign    Worried About Running Out of Food in the Last Year: Sometimes true    Ran Out of Food in the Last Year: Sometimes true  Transportation Needs: No Transportation Needs (11/19/2023)   PRAPARE - Administrator, Civil Service (Medical): No    Lack of Transportation (Non-Medical): No  Physical Activity: Sufficiently Active (11/19/2023)   Exercise Vital Sign    Days of Exercise per Week: 4 days    Minutes of Exercise per Session: 50 min  Stress: Stress Concern Present (11/19/2023)   [employer] of Occupational Health - Occupational Stress Questionnaire    Feeling of Stress: To some extent  Social Connections: Socially Isolated (11/19/2023)   Social Connection and Isolation Panel    Frequency of Communication with Friends and Family: More than three times a week    Frequency of Social Gatherings with Friends and Family: More than three times a week    Attends Religious Services: Never    Database Administrator or Organizations: No    Attends Engineer, Structural: Not on file    Marital Status: Divorced  Catering Manager Violence: Not on file   Family History  Problem Relation Age of Onset   Stroke Paternal Aunt    Breast cancer Paternal Aunt 60   Stroke Maternal Grandmother    Hyperlipidemia Mother    Hypertension Mother    Varicose Veins Mother    Diabetes Father    Hyperlipidemia Father    Hypertension Father    Depression Sister    Colon cancer Neg Hx    Current Outpatient Medications on File Prior to Visit  Medication Sig   hydrochlorothiazide  (HYDRODIURIL ) 25 MG  tablet Take 1 tablet (25 mg total) by mouth daily.   levothyroxine  (SYNTHROID ) 150 MCG tablet Take 1 tablet (150 mcg total) by mouth daily before breakfast.   losartan  (COZAAR ) 100 MG tablet Take 1 tablet (100 mg total) by mouth daily.   omeprazole  (PRILOSEC) 40 MG capsule Take 1 capsule (40 mg total) by mouth daily.   No current  facility-administered medications on file prior to visit.    Review of Systems Per HPI unless specifically indicated above     Objective:    BP 124/80 (BP Location: Left Arm, Patient Position: Sitting, Cuff Size: Large)   Pulse (!) 55   Ht 5' 8.11 (1.73 m)   Wt 262 lb 8 oz (119.1 kg)   SpO2 96%   BMI 39.78 kg/m   Wt Readings from Last 3 Encounters:  11/19/23 262 lb 8 oz (119.1 kg)  10/11/22 257 lb (116.6 kg)  01/11/21 279 lb (126.6 kg)    Physical Exam  Results for orders placed or performed during the hospital encounter of 10/18/22  Cytology - Non PAP; THYROID  ISTHMUS   Collection Time: 10/18/22  2:30 PM  Result Value Ref Range   CYTOLOGY - NON GYN      CYTOLOGY - NON PAP CASE: [id number] PATIENT: Ashley Clarke Non-Gynecological Cytology Report     Clinical History: Isthmus mid 1.7cm; Other 2 dimensions: 1.1 x 1.2cm, previously 1.5 x 1.2 x 1.0cm, Solid / almost completely solid, Hypoechoic, TI-RADS total points 7 Specimen Submitted:  A. 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  GROSS: Received is/are 30cc's of peach cytolyt solution.(GW:gw) Smears: 0 Concentration Method (Thin Prep): 1 Cell Block: Cell block attempted, but not obtained Additional Studies: Afirma collected.     Final Diagnosis  performed by Prentice Pitcher, MD.   Electronically signed 10/22/2022 Technical a nd / or Professional components performed at [facility], [address], [place], [place] [address].  Immunohistochemistry Technical component (if applicable) was performed at [facility]. [address], [place], [place] [address].   IMMUNOHISTOCHEMISTRY DISCLAIMER (if applicable): Some of these immunohistochemical stains may have been developed and the performance characteristics determine by [facility]. Some may not have been cleared or approved by the U.S. Food and Drug Administration. The FDA has determined that such clearance or approval is not necessary. This test is used for clinical purposes. It should not be regarded as investigational or for research. This laboratory is certified under the Clinical Laboratory Improvement Amendments of 1988 (CLIA-88) as qualified to perform high complexity clinical laboratory testing.  The controls stained appropriately.   IHC stains are performed on formalin fixed, pa raffin embedded tissue using a 3,3diaminobenzidine (DAB) chromogen and Leica Bond Autostainer System. The staining intensity of the nucleus is score manually and is reported as the percentage of tumor cell nuclei demonstrating specific nuclear staining. The specimens are fixed in 10% Neutral Formalin for at least 6 hours and up to 72hrs. These tests are validated on decalcified tissue. Results should be interpreted with caution given the possibility of false negative results on decalcified specimens. Antibody Clones are as follows ER-clone 67F, PR-clone 16, Ki67- clone MM1. Some of these immunohistochemical stains may have been developed and the performance characteristics determined by [facility].       Assessment & Plan:   Problem List Items Addressed This Visit     Adult hypothyroidism   Essential (primary) hypertension    Other Visit Diagnoses       Annual physical exam    -  Primary     Elevated hemoglobin A1c  Updated Health Maintenance information Reviewed recent lab results with patient Encouraged improvement to lifestyle with diet and exercise Goal of weight loss  Assessment and Plan Assessment & Plan      No orders of the defined types were placed in this encounter.   No orders of the defined types were placed in this encounter.    Follow up plan: No follow-ups on file.  Marsa Officer, DO [facility] 11/19/2023, 9:48 AM

## 2023-11-20 NOTE — Telephone Encounter (Signed)
 Refilled 11/19/23. Requested Prescriptions  Refused Prescriptions Disp Refills   hydrochlorothiazide  (HYDRODIURIL ) 25 MG tablet [Pharmacy Med Name: HYDROCHLOROTHIAZIDE  25MG  TABLETS] 90 tablet     Sig: TAKE 1 TABLET(25 MG) BY MOUTH DAILY     Cardiovascular: Diuretics - Thiazide Failed - 11/20/2023  3:23 PM      Failed - Cr in normal range and within 180 days    Creat  Date Value Ref Range Status  03/13/2016 0.81 0.50 - 1.10 mg/dL Final   Creatinine, Ser  Date Value Ref Range Status  11/13/2022 0.87 0.57 - 1.00 mg/dL Final         Failed - K in normal range and within 180 days    Potassium  Date Value Ref Range Status  11/13/2022 3.2 (L) 3.5 - 5.2 mmol/L Final  05/22/2011 3.2 (L) 3.5 - 5.1 mmol/L Final    Comment:    POTASSIUM - Slight hemolysis, interpret results with  - caution.          Failed - Na in normal range and within 180 days    Sodium  Date Value Ref Range Status  11/13/2022 138 134 - 144 mmol/L Final         Passed - Last BP in normal range    BP Readings from Last 1 Encounters:  11/19/23 124/80         Passed - Valid encounter within last 6 months    Recent Outpatient Visits           Yesterday Annual physical exam   [place], Marsa PARAS, DO               omeprazole  (PRILOSEC) 40 MG capsule [Pharmacy Med Name: OMEPRAZOLE  40MG  CAPSULES] 90 capsule     Sig: TAKE 1 CAPSULE(40 MG) BY MOUTH DAILY BEFORE BREAKFAST     Gastroenterology: Proton Pump Inhibitors Passed - 11/20/2023  3:23 PM      Passed - Valid encounter within last 12 months    Recent Outpatient Visits           Yesterday Annual physical exam   [place] [facility], Marsa PARAS, DO               losartan  (COZAAR ) 100 MG tablet [Pharmacy Med Name: LOSARTAN  100MG  TABLETS] 90 tablet     Sig: TAKE 1 TABLET(100 MG) BY MOUTH DAILY     Cardiovascular:  Angiotensin Receptor Blockers Failed - 11/20/2023  3:23 PM       Failed - Cr in normal range and within 180 days    Creat  Date Value Ref Range Status  03/13/2016 0.81 0.50 - 1.10 mg/dL Final   Creatinine, Ser  Date Value Ref Range Status  11/13/2022 0.87 0.57 - 1.00 mg/dL Final         Failed - K in normal range and within 180 days    Potassium  Date Value Ref Range Status  11/13/2022 3.2 (L) 3.5 - 5.2 mmol/L Final  05/22/2011 3.2 (L) 3.5 - 5.1 mmol/L Final    Comment:    POTASSIUM - Slight hemolysis, interpret results with  - caution.          Passed - Patient is not pregnant      Passed - Last BP in normal range    BP Readings from Last 1 Encounters:  11/19/23 124/80         Passed - Valid encounter within last 6 months  Recent Outpatient Visits           Yesterday Annual physical exam   [facility] [place], Marsa PARAS, [place]

## 2023-11-21 ENCOUNTER — Encounter: Payer: Self-pay | Admitting: Internal Medicine

## 2023-11-22 ENCOUNTER — Ambulatory Visit: Payer: Self-pay | Admitting: Family Medicine

## 2023-11-22 LAB — COMPREHENSIVE METABOLIC PANEL WITH GFR
ALT: 17 IU/L (ref 0–32)
AST: 14 IU/L (ref 0–40)
Albumin: 4.4 g/dL (ref 3.9–4.9)
Alkaline Phosphatase: 80 IU/L (ref 41–116)
BUN/Creatinine Ratio: 12 (ref 9–23)
BUN: 10 mg/dL (ref 6–24)
Bilirubin Total: 0.9 mg/dL (ref 0.0–1.2)
CO2: 21 mmol/L (ref 20–29)
Calcium: 9.5 mg/dL (ref 8.7–10.2)
Chloride: 103 mmol/L (ref 96–106)
Creatinine, Ser: 0.85 mg/dL (ref 0.57–1.00)
Globulin, Total: 3.1 g/dL (ref 1.5–4.5)
Glucose: 101 mg/dL — ABNORMAL HIGH (ref 70–99)
Potassium: 3.7 mmol/L (ref 3.5–5.2)
Sodium: 140 mmol/L (ref 134–144)
Total Protein: 7.5 g/dL (ref 6.0–8.5)
eGFR: 84 mL/min/1.73 (ref >59)

## 2023-11-22 LAB — CBC WITH DIFFERENTIAL/PLATELET
Basophils Absolute: 0 x10E3/uL (ref 0.0–0.2)
Basos: 1 %
EOS (ABSOLUTE): 0.4 x10E3/uL (ref 0.0–0.4)
Eos: 4 %
Hematocrit: 42.8 % (ref 34.0–46.6)
Hemoglobin: 13.9 g/dL (ref 11.1–15.9)
Immature Grans (Abs): 0 x10E3/uL (ref 0.0–0.1)
Immature Granulocytes: 0 %
Lymphocytes Absolute: 3.1 x10E3/uL (ref 0.7–3.1)
Lymphs: 35 %
MCH: 29.2 pg (ref 26.6–33.0)
MCHC: 32.5 g/dL (ref 31.5–35.7)
MCV: 90 fL (ref 79–97)
Monocytes Absolute: 0.5 x10E3/uL (ref 0.1–0.9)
Monocytes: 6 %
Neutrophils Absolute: 4.7 x10E3/uL (ref 1.4–7.0)
Neutrophils: 54 %
Platelets: 258 x10E3/uL (ref 150–450)
RBC: 4.76 x10E6/uL (ref 3.77–5.28)
RDW: 13.7 % (ref 11.7–15.4)
WBC: 8.7 x10E3/uL (ref 3.4–10.8)

## 2023-11-22 LAB — T4, FREE: Free T4: 0.47 ng/dL — ABNORMAL LOW (ref 0.82–1.77)

## 2023-11-22 LAB — HEMOGLOBIN A1C
Est. average glucose Bld gHb Est-mCnc: 111 mg/dL
Hgb A1c MFr Bld: 5.5 % (ref 4.8–5.6)

## 2023-11-22 LAB — TSH: TSH: 47.5 u[IU]/mL — ABNORMAL HIGH (ref 0.450–4.500)

## 2023-11-22 LAB — LIPID PANEL
Chol/HDL Ratio: 6.3 ratio — ABNORMAL HIGH (ref 0.0–4.4)
Cholesterol, Total: 208 mg/dL — ABNORMAL HIGH (ref 100–199)
HDL: 33 mg/dL — ABNORMAL LOW (ref >39)
LDL Chol Calc (NIH): 144 mg/dL — ABNORMAL HIGH (ref 0–99)
Triglycerides: 171 mg/dL — ABNORMAL HIGH (ref 0–149)
VLDL Cholesterol Cal: 31 mg/dL (ref 5–40)

## 2023-11-22 LAB — HEPATITIS B SURFACE ANTIBODY,QUALITATIVE: Hep B Surface Ab, Qual: NONREACTIVE

## 2023-11-27 ENCOUNTER — Encounter: Payer: Self-pay | Admitting: Internal Medicine

## 2023-11-27 ENCOUNTER — Other Ambulatory Visit
Admission: RE | Admit: 2023-11-27 | Discharge: 2023-11-27 | Disposition: A | Discharge status: Home / Self Care | Payer: Self-pay | Source: Ambulatory Visit | Attending: Medical Genetics | Admitting: Medical Genetics

## 2023-11-27 ENCOUNTER — Ambulatory Visit: Admitting: Internal Medicine

## 2023-11-27 VITALS — BP 120/74 | Ht 68.5 in | Wt 253.0 lb

## 2023-11-27 DIAGNOSIS — Z23 Encounter for immunization: Secondary | ICD-10-CM

## 2023-11-27 DIAGNOSIS — Z01419 Encounter for gynecological examination (general) (routine) without abnormal findings: Secondary | ICD-10-CM

## 2023-11-27 NOTE — Progress Notes (Signed)
 Subjective:    Patient ID: Ashley Clarke, female    DOB: [birth year], 48 y.o.   MRN: [id number]  HPI  Patient presents to the clinic today for her Pap smear.  Per her report, her last Pap smear was in 2016 at [facility] although there is no record of this in epic.  She did have a partial abdominal hysterectomy in 2012.  She reports that she still has her ovaries.  She denies any vaginal complaints such as discharge, itching, odor or abnormal uterine bleeding.  She is unsure if she has gone through menopause but reports she has been having intermittent hot flashes at night.  She also reports her PCP recommended her first dose of hep B today.  Review of Systems   Past Medical History:  Diagnosis Date   Hyperlipidemia    Hypertension    Hypothyroidism    PONV (postoperative nausea and vomiting)    Sleep apnea 2011    Current Outpatient Medications  Medication Sig Dispense Refill   hydrochlorothiazide  (HYDRODIURIL ) 25 MG tablet Take 1 tablet (25 mg total) by mouth daily. 30 tablet 0   hydrochlorothiazide  (HYDRODIURIL ) 25 MG tablet Take 1 tablet (25 mg total) by mouth daily. 90 tablet 3   levothyroxine  (SYNTHROID ) 150 MCG tablet Take 1 tablet (150 mcg total) by mouth daily before breakfast.     losartan  (COZAAR ) 100 MG tablet Take 1 tablet (100 mg total) by mouth daily. 30 tablet 0   losartan  (COZAAR ) 100 MG tablet Take 1 tablet (100 mg total) by mouth daily. 90 tablet 3   omeprazole  (PRILOSEC) 40 MG capsule Take 1 capsule (40 mg total) by mouth daily before breakfast. 30 capsule 0   omeprazole  (PRILOSEC) 40 MG capsule Take 1 capsule (40 mg total) by mouth daily. 90 capsule 3   No current facility-administered medications for this visit.    Allergies  Allergen Reactions   Lisinopril Other (See Comments)    Headache, cognitive, confusion, did not feel right    Family History  Problem Relation Age of Onset   Stroke Paternal Aunt    Breast cancer Paternal Aunt 75    Stroke Maternal Grandmother    Hyperlipidemia Mother    Hypertension Mother    Varicose Veins Mother    Diabetes Father    Hyperlipidemia Father    Hypertension Father    Depression Sister    Colon cancer Neg Hx     Social History   Socioeconomic History   Marital status: Divorced    Spouse name: Not on file   Number of children: Not on file   Years of education: Not on file   Highest education level: GED or equivalent  Occupational History   Occupation: Paramedic  Tobacco Use   Smoking status: Some Days    Current packs/day: 0.25    Average packs/day: 0.3 packs/day for 30.0 years (7.5 ttl pk-yrs)    Types: Cigarettes   Smokeless tobacco: Never   Tobacco comments:    < 0.5ppd avg smoking  Vaping Use   Vaping status: Never Used  Substance and Sexual Activity   Alcohol use: No   Drug use: No   Sexual activity: Not Currently    Birth control/protection: Surgical    Comment: Hysterectomy  Other Topics Concern   Not on file  Social History Narrative   Not on file   Social Drivers of Health   Financial Resource Strain: Medium Risk (11/19/2023)   Overall Financial Resource Strain (CARDIA)  Difficulty of Paying Living Expenses: Somewhat hard  Food Insecurity: Food Insecurity Present (11/19/2023)   Hunger Vital Sign    Worried About Running Out of Food in the Last Year: Sometimes true    Ran Out of Food in the Last Year: Sometimes true  Transportation Needs: No Transportation Needs (11/19/2023)   PRAPARE - Administrator, Civil Service (Medical): No    Lack of Transportation (Non-Medical): No  Physical Activity: Sufficiently Active (11/19/2023)   Exercise Vital Sign    Days of Exercise per Week: 4 days    Minutes of Exercise per Session: 50 min  Stress: Stress Concern Present (11/19/2023)   [employer] of Occupational Health - Occupational Stress Questionnaire    Feeling of Stress: To some extent  Social Connections: Socially Isolated  (11/19/2023)   Social Connection and Isolation Panel    Frequency of Communication with Friends and Family: More than three times a week    Frequency of Social Gatherings with Friends and Family: More than three times a week    Attends Religious Services: Never    Database Administrator or Organizations: No    Attends Engineer, Structural: Not on file    Marital Status: Divorced  Intimate Partner Violence: Not on file     Constitutional: Denies fever, malaise, fatigue, headache or abrupt weight changes.  Respiratory: Denies difficulty breathing, shortness of breath, cough or sputum production.   Cardiovascular: Denies chest pain, chest tightness, palpitations or swelling in the hands or feet.  Gastrointestinal: Denies abdominal pain, bloating, constipation, diarrhea or blood in the stool.  GU: Denies urgency, frequency, pain with urination, burning sensation, blood in urine, odor or discharge.  No other specific complaints in a complete review of systems (except as listed in HPI above).      Objective:   Physical Exam  BP 120/74 (BP Location: Left Arm, Patient Position: Sitting, Cuff Size: Large)   Ht 5' 8.5 (1.74 m)   Wt 253 lb (114.8 kg)   BMI 37.91 kg/m   Wt Readings from Last 3 Encounters:  11/19/23 262 lb 8 oz (119.1 kg)  10/11/22 257 lb (116.6 kg)  01/11/21 279 lb (126.6 kg)    General: Appears her stated age, obese, in NAD. Cardiovascular: Normal rate and rhythm.  Pulmonary/Chest: Normal effort and positive vesicular breath sounds. No respiratory distress. No wheezes, rales or ronchi noted.  Abdomen: Soft and nontender. No distention or masses noted.  Pelvic: Normal female anatomy.  Cervix not visualized.  No discharge or abnormal bleeding noted in the vaginal vault.  Adnexa nonpalpable. Neurological: Alert and oriented.   BMET    Component Value Date/Time   NA 140 11/21/2023 1617   K 3.7 11/21/2023 1617   K 3.2 (L) 05/22/2011 0627   CL 103 11/21/2023  1617   CO2 21 11/21/2023 1617   GLUCOSE 101 (H) 11/21/2023 1617   GLUCOSE 113 (H) 06/27/2018 1801   BUN 10 11/21/2023 1617   CREATININE 0.85 11/21/2023 1617   CREATININE 0.81 03/13/2016 0001   CALCIUM 9.5 11/21/2023 1617   GFRNONAA 96 05/22/2019 0848   GFRNONAA >89 03/13/2016 0001   GFRAA 110 05/22/2019 0848   GFRAA >89 03/13/2016 0001    Lipid Panel     Component Value Date/Time   CHOL 208 (H) 11/21/2023 1617   TRIG 171 (H) 11/21/2023 1617   HDL 33 (L) 11/21/2023 1617   CHOLHDL 6.3 (H) 11/21/2023 1617   CHOLHDL 5.3 (H) 03/13/2016 0001  VLDL 22 03/13/2016 0001   LDLCALC 144 (H) 11/21/2023 1617    CBC    Component Value Date/Time   WBC 8.7 11/21/2023 1617   WBC 10.7 (H) 06/27/2018 1801   RBC 4.76 11/21/2023 1617   RBC 4.68 06/27/2018 1801   HGB 13.9 11/21/2023 1617   HCT 42.8 11/21/2023 1617   PLT 258 11/21/2023 1617   MCV 90 11/21/2023 1617   MCV 89 05/15/2011 0953   MCH 29.2 11/21/2023 1617   MCH 29.7 06/27/2018 1801   MCHC 32.5 11/21/2023 1617   MCHC 33.7 06/27/2018 1801   RDW 13.7 11/21/2023 1617   RDW 14.0 05/15/2011 0953   LYMPHSABS 3.1 11/21/2023 1617   MONOABS 0.5 06/27/2018 1801   EOSABS 0.4 11/21/2023 1617   BASOSABS 0.0 11/21/2023 1617    Hgb A1C Lab Results  Component Value Date   HGBA1C 5.5 11/21/2023            Assessment & Plan:   Routine GYN exam:  Explained to her that she does not need Pap smears but rather pelvic exams every 5 years Pelvic exam performed today, no complications or concerns  Need for hep B vaccination:  Hep B dose #1 today Nurse visit provided so that she can receive dose #2  Follow-up with your PCP as previously scheduled Angeline Laura, NP

## 2023-11-27 NOTE — Patient Instructions (Signed)
 Pelvic Exam A pelvic exam is an exam of a woman's outer and inner genitals and reproductive organs. Pelvic exams are done to screen for health problems and to help prevent health problems from developing. A pelvic exam may be recommended to help explain or diagnose: Changes in your body that may be signs of cancer in the reproductive system. Inability to get pregnant (infertility). Cause of vaginal itching or burning. Abnormal vaginal discharge or bleeding. Problems with sexual function. Problems with urination. Problems with menstrual periods. Problems with the position of your pelvic organs due to weakening muscles (prolapse). Tell a health care provider about: Any allergies you have. All medicines you are taking, including vitamins, herbs, eye drops, creams, and over-the-counter medicines. Any problems you or family members have had with anesthetic medicines. Any bleeding problems you have. Any surgeries you have had. Any medical conditions you have. Whether you are pregnant or may be pregnant. What are the risks? This is a safe procedure. There are no known risks or complications of having this test. What happens before the procedure? Usually, a physical exam is done first. This may include: An exam of your breasts. Your health care provider may feel your breasts to check for abnormalities. An exam of your abdomen. Your health care provider may press on your abdomen to check for abnormalities. What happens during the procedure? Pelvic exams may vary among health care providers and hospitals. The following things usually take place during a pelvic exam: You will undress from the waist down. You will put on a gown or a wrap to cover yourself while you get ready for the exam. You will lie on your back on an exam table. You will place your feet into foot rests (stirrups) so that your legs are wide apart and your knees are bent. A drape will be placed over your abdomen and your legs. Your  health care provider will wear gloves and examine your outer genitals to check for anything unusual. This includes your clitoris, urethra, vaginal opening, labia, and the skin between your vagina and your anus (perineum). Your health care provider will examine your inner genitals. To do this, a lubricated instrument (speculum) will be inserted into your vagina. The speculum will be widened to open the walls of your vagina. Your health care provider will examine your vagina and cervix. A Pap test, cervical biopsy, or cultures may be done as needed. After the internal exam is done, the speculum will be removed. Your health care provider will insert two fingers into your vagina to gently press against various organs. Your health care provider may use his or her other hand to gently press on your lower abdomen while doing this. A pelvic exam is usually painless, although it can cause mild discomfort. If you experience pain at any time during your pelvic exam, tell your health care provider right away. Depending on the purpose of your pelvic exam, your health care provider may perform: A Pap test. This is sometimes called a Pap smear. It is a screening test that is used to check for signs of cancer of the cervix. The test can also identify the presence of infection or precancerous changes. A cervical biopsy. This is the removal of a small sample of tissue from the cervix. The cervix is the lowest part of the uterus, which opens into the vagina (birth canal). The tissue will be checked under a microscope. Other diagnostic tests that involve taking samples of tissue or fluid (cultures). What can I  expect after the procedure? You may have very light bleeding, particularly if a biopsy or cultures were obtained. It is up to you to get the results of your procedure. Ask your health care provider, or the department that is doing the procedure, when your results will be ready. Summary A pelvic exam is an exam of  a woman's outer and inner genitals and reproductive organs. A pelvic exam may be recommended to help explain or diagnose various problems with your pelvic organs. You may experience mild discomfort during a pelvic exam. This information is not intended to replace advice given to you by your health care provider. Make sure you discuss any questions you have with your health care provider. Document Revised: 09/14/2020 Document Reviewed: 04/01/2020 [employer] Patient Education  2024 [employer].

## 2023-11-28 LAB — COLOGUARD

## 2023-12-09 LAB — GENECONNECT MOLECULAR SCREEN: Genetic Analysis Overall Interpretation: NEGATIVE

## 2024-11-18 ENCOUNTER — Encounter: Admitting: Family Medicine
